# Patient Record
Sex: Female | Born: 1950 | Race: Black or African American | Hispanic: No | Smoking: Never smoker
Health system: Southern US, Community
[De-identification: ages and names within clinical notes are randomized; demographics above are authoritative.]

## PROBLEM LIST (undated history)

## (undated) DIAGNOSIS — M545 Low back pain, unspecified: Secondary | ICD-10-CM

## (undated) DIAGNOSIS — I10 Essential (primary) hypertension: Secondary | ICD-10-CM

## (undated) DIAGNOSIS — G8929 Other chronic pain: Secondary | ICD-10-CM

## (undated) DIAGNOSIS — E785 Hyperlipidemia, unspecified: Secondary | ICD-10-CM

## (undated) DIAGNOSIS — K5900 Constipation, unspecified: Secondary | ICD-10-CM

## (undated) DIAGNOSIS — R9431 Abnormal electrocardiogram [ECG] [EKG]: Secondary | ICD-10-CM

## (undated) DIAGNOSIS — R079 Chest pain, unspecified: Secondary | ICD-10-CM

## (undated) DIAGNOSIS — M797 Fibromyalgia: Secondary | ICD-10-CM

## (undated) HISTORY — DX: Abnormal electrocardiogram (ECG) (EKG): R94.31

## (undated) HISTORY — DX: Chest pain, unspecified: R07.9

## (undated) HISTORY — PX: CARPAL TUNNEL RELEASE: SHX101

---

## 1999-05-13 HISTORY — PX: BACK SURGERY: SHX140

## 2016-07-25 ENCOUNTER — Observation Stay (HOSPITAL_COMMUNITY)
Admission: EM | Admit: 2016-07-25 | Discharge: 2016-07-26 | Disposition: A | Discharge status: Home / Self Care | Payer: Medicare Other | Attending: Internal Medicine | Admitting: Internal Medicine

## 2016-07-25 ENCOUNTER — Emergency Department (HOSPITAL_COMMUNITY): Payer: Medicare Other

## 2016-07-25 ENCOUNTER — Encounter (HOSPITAL_COMMUNITY): Payer: Self-pay

## 2016-07-25 DIAGNOSIS — R072 Precordial pain: Secondary | ICD-10-CM | POA: Diagnosis not present

## 2016-07-25 DIAGNOSIS — Z7982 Long term (current) use of aspirin: Secondary | ICD-10-CM | POA: Diagnosis not present

## 2016-07-25 DIAGNOSIS — E785 Hyperlipidemia, unspecified: Secondary | ICD-10-CM | POA: Diagnosis not present

## 2016-07-25 DIAGNOSIS — R0609 Other forms of dyspnea: Secondary | ICD-10-CM | POA: Diagnosis not present

## 2016-07-25 DIAGNOSIS — I10 Essential (primary) hypertension: Secondary | ICD-10-CM

## 2016-07-25 DIAGNOSIS — R51 Headache: Secondary | ICD-10-CM | POA: Diagnosis not present

## 2016-07-25 DIAGNOSIS — E782 Mixed hyperlipidemia: Secondary | ICD-10-CM

## 2016-07-25 DIAGNOSIS — R519 Headache, unspecified: Secondary | ICD-10-CM

## 2016-07-25 DIAGNOSIS — Z79899 Other long term (current) drug therapy: Secondary | ICD-10-CM | POA: Insufficient documentation

## 2016-07-25 DIAGNOSIS — R079 Chest pain, unspecified: Secondary | ICD-10-CM | POA: Diagnosis not present

## 2016-07-25 DIAGNOSIS — R9431 Abnormal electrocardiogram [ECG] [EKG]: Secondary | ICD-10-CM | POA: Diagnosis not present

## 2016-07-25 HISTORY — DX: Low back pain, unspecified: M54.50

## 2016-07-25 HISTORY — DX: Low back pain: M54.5

## 2016-07-25 HISTORY — DX: Constipation, unspecified: K59.00

## 2016-07-25 HISTORY — DX: Fibromyalgia: M79.7

## 2016-07-25 HISTORY — DX: Hyperlipidemia, unspecified: E78.5

## 2016-07-25 HISTORY — DX: Essential (primary) hypertension: I10

## 2016-07-25 HISTORY — DX: Other chronic pain: G89.29

## 2016-07-25 LAB — CBC
HEMATOCRIT: 38.9 % (ref 36.0–46.0)
HEMATOCRIT: 38.7 % (ref 36.0–46.0)
HEMOGLOBIN: 12.4 g/dL (ref 12.0–15.0)
Hemoglobin: 12.4 g/dL (ref 12.0–15.0)
MCH: 28.4 pg (ref 26.0–34.0)
MCH: 28.5 pg (ref 26.0–34.0)
MCHC: 31.9 g/dL (ref 30.0–36.0)
MCHC: 32 g/dL (ref 30.0–36.0)
MCV: 89.2 fL (ref 78.0–100.0)
MCV: 89 fL (ref 78.0–100.0)
PLATELETS: 185 10*3/uL (ref 150–400)
Platelets: 201 10*3/uL (ref 150–400)
RBC: 4.36 MIL/uL (ref 3.87–5.11)
RBC: 4.35 MIL/uL (ref 3.87–5.11)
RDW: 13.2 % (ref 11.5–15.5)
RDW: 13.2 % (ref 11.5–15.5)
WBC: 4 10*3/uL (ref 4.0–10.5)
WBC: 5.2 10*3/uL (ref 4.0–10.5)

## 2016-07-25 LAB — BASIC METABOLIC PANEL
ANION GAP: 9 (ref 5–15)
BUN: 12 mg/dL (ref 6–20)
CHLORIDE: 105 mmol/L (ref 101–111)
CO2: 25 mmol/L (ref 22–32)
Calcium: 9.4 mg/dL (ref 8.9–10.3)
Creatinine, Ser: 0.82 mg/dL (ref 0.44–1.00)
GFR calc Af Amer: >60 mL/min (ref >60)
GLUCOSE: 116 mg/dL — AB (ref 65–99)
POTASSIUM: 3.8 mmol/L (ref 3.5–5.1)
Sodium: 139 mmol/L (ref 135–145)

## 2016-07-25 LAB — CREATININE, SERUM: Creatinine, Ser: 0.89 mg/dL (ref 0.44–1.00)

## 2016-07-25 LAB — I-STAT TROPONIN, ED: Troponin i, poc: 0 ng/mL (ref 0.00–0.08)

## 2016-07-25 LAB — LIPASE, BLOOD: Lipase: 15 U/L (ref 11–51)

## 2016-07-25 LAB — TROPONIN I: Troponin I: <0.03 ng/mL (ref <0.03)

## 2016-07-25 MED ORDER — ORPHENADRINE CITRATE ER 100 MG PO TB12
100.0000 mg | ORAL_TABLET | Freq: Two times a day (BID) | ORAL | Status: DC
  Administered 2016-07-26 (×2): 100 mg via ORAL
  Filled 2016-07-25 (×3): qty 1

## 2016-07-25 MED ORDER — SODIUM CHLORIDE 0.9% FLUSH: 3.0000 mL | INTRAVENOUS | Status: DC | PRN

## 2016-07-25 MED ORDER — SODIUM CHLORIDE 0.9 % IV SOLN: 250.0000 mL | INTRAVENOUS | Status: DC | PRN

## 2016-07-25 MED ORDER — IOPAMIDOL (ISOVUE-370) INJECTION 76%
INTRAVENOUS | Status: AC
  Filled 2016-07-25: qty 100

## 2016-07-25 MED ORDER — LINACLOTIDE 145 MCG PO CAPS
290.0000 ug | ORAL_CAPSULE | Freq: Every day | ORAL | Status: DC
  Administered 2016-07-26: 290 ug via ORAL
  Filled 2016-07-25: qty 2

## 2016-07-25 MED ORDER — HYDROCODONE-ACETAMINOPHEN 7.5-325 MG PO TABS
1.0000 | ORAL_TABLET | Freq: Four times a day (QID) | ORAL | Status: DC | PRN
  Administered 2016-07-26: 1 via ORAL
  Filled 2016-07-25: qty 1

## 2016-07-25 MED ORDER — PANTOPRAZOLE SODIUM 40 MG PO TBEC
40.0000 mg | DELAYED_RELEASE_TABLET | Freq: Every day | ORAL | Status: DC
  Administered 2016-07-26: 40 mg via ORAL
  Filled 2016-07-25 (×2): qty 1

## 2016-07-25 MED ORDER — ZOLPIDEM TARTRATE 5 MG PO TABS: 5.0000 mg | ORAL_TABLET | Freq: Every evening | ORAL | Status: DC | PRN

## 2016-07-25 MED ORDER — ONDANSETRON HCL 4 MG/2ML IJ SOLN: 4.0000 mg | Freq: Four times a day (QID) | INTRAMUSCULAR | Status: DC | PRN

## 2016-07-25 MED ORDER — SODIUM CHLORIDE 0.9% FLUSH
3.0000 mL | Freq: Two times a day (BID) | INTRAVENOUS | Status: DC
  Administered 2016-07-26 (×2): 3 mL via INTRAVENOUS

## 2016-07-25 MED ORDER — SIMVASTATIN 10 MG PO TABS
10.0000 mg | ORAL_TABLET | Freq: Every day | ORAL | Status: DC
  Filled 2016-07-25: qty 1

## 2016-07-25 MED ORDER — ACETAMINOPHEN 325 MG PO TABS: 650.0000 mg | ORAL_TABLET | ORAL | Status: DC | PRN

## 2016-07-25 MED ORDER — ASPIRIN EC 81 MG PO TBEC
81.0000 mg | DELAYED_RELEASE_TABLET | Freq: Every day | ORAL | Status: DC
  Administered 2016-07-26: 81 mg via ORAL
  Filled 2016-07-25: qty 1

## 2016-07-25 MED ORDER — NITROGLYCERIN 0.4 MG SL SUBL
0.4000 mg | SUBLINGUAL_TABLET | SUBLINGUAL | Status: DC | PRN
  Administered 2016-07-25: 0.4 mg via SUBLINGUAL
  Filled 2016-07-25: qty 1

## 2016-07-25 MED ORDER — ENOXAPARIN SODIUM 40 MG/0.4ML ~~LOC~~ SOLN: 40.0000 mg | Freq: Every day | SUBCUTANEOUS | Status: DC

## 2016-07-25 NOTE — ED Notes (Signed)
RN attempted to call report; RN to call back; 

## 2016-07-25 NOTE — H&P (Signed)
History & Physical    Patient ID: Samantha Haley MRN: [id number], DOB/AGE: 05/24/1950   Admit date: 07/25/2016   Primary Physician: Pcp Not In System Primary Cardiologist: New - will f/u in [place]  Patient Profile    66 year old female without prior cardiac history who presents on transfer from [facility] with chest pain and concern for EKG changes.  Past Medical History    Past Medical History:  Diagnosis Date  . Chronic lower back pain   . Constipation   . Fibromyalgia   . Hyperlipidemia   . Hypertension     Past Surgical History:  Procedure Laterality Date  . BACK SURGERY  2001     Allergies  Allergies  Allergen Reactions  . Lipitor [Atorvastatin]     Myalgias   . Lovastatin     myalgias  . Sulfa Antibiotics     History of Present Illness    66 year old female with a prior cardiac history. She does have a history of hypertension, hyperlipidemia, follow myalgia, and chronic low back pain. 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 She has never smoked cigarettes and despite her chronic back pain is reasonably active, performing water aerobics up to 5 times per week. She was in her usual state of health until earlier this week, when she completed water aerobics and walking getting out of the pool, noted dyspnea. This was unusual for her. She again had some dyspnea on Thursday, March 15 and was awakened by substernal chest heaviness and dyspnea in the early morning hours of March 16. This persisted throughout the morning and became associated with left arm numbness. For these reasons, she presented to a local urgent care in [place]. She was advised that she would need to present to the emergency department. Her friend drove her to the emergency department at [facility] where there was concern for possible ST segment elevation. A code STEMI was called and she was transferred to [facility] for further evaluation. She  continued to have nagging chest discomfort throughout the trip to [facility]. Follow-up EKG here showed no evidence of ST segment elevation. Review of EKGs from [facility] show significant baseline artifact. Areas of concern for ST segment elevation were notable for significant artifact. Troponin here is normal. Patient continues to have mild chest discomfort and a sensation as though she is dyspneic. Vital signs are stable and she is oxygenating well. Chest x-ray is without acute findings.  Of note, she has also had headaches, which have been concerning for her as she does not usually have headaches.  Home Medications    Prior to Admission medications   Medication Sig Start Date End Date Taking? Authorizing Provider  aspirin EC 81 MG tablet Take 81 mg by mouth daily.   Yes Historical Provider, MD  eszopiclone (LUNESTA) 1 MG TABS tablet Take 1 mg by mouth at bedtime. 07/01/16  Yes Historical Provider, MD  HYDROcodone-acetaminophen (NORCO) 7.5-325 MG tablet Take 1 tablet by mouth every 6 (six) hours as needed for pain. 07/07/16  Yes Historical Provider, MD  LINZESS 290 MCG CAPS capsule Take 290 mcg by mouth daily. 30 minutes prior to first meal 07/17/16  Yes Historical Provider, MD  orphenadrine (NORFLEX) 100 MG tablet Take 100 mg by mouth 2 (two) times daily. 07/07/16  Yes Historical Provider, MD  simvastatin (ZOCOR) 10 MG tablet Take 10 mg by mouth daily at 6 PM.   Yes Historical Provider, MD    Family History  Family History  Problem Relation Age of Onset  . Hypertension Mother   . Hyperlipidemia Mother   . Alzheimer's disease Father   . Diabetes Father   . Hypertension Father   . Hypertension Sister   . Rheumatic fever Sister    Social History    Social History   Social History  . Marital status: Divorced    Spouse name: N/A  . Number of children: N/A  . Years of education: N/A   Occupational History  . Not on file.   Social History Main Topics  . Smoking status: Never Smoker  .  Smokeless tobacco: Never Used  . Alcohol use No  . Drug use: No  . Sexual activity: Not on file   Other Topics Concern  . Not on file   Social History Narrative   Lives in [place], [place].  Lives with a 66 y/o man that she helps to take care of. H2O aerobics 5 d/wk.  Previously was licensed as a Lawyer.     Review of Systems    General:  No chills, fever, night sweats or weight changes.  Cardiovascular:  +++ chest pain, +++ dyspnea on exertion, no edema, orthopnea, palpitations, paroxysmal nocturnal dyspnea. Dermatological: No rash, lesions/masses Respiratory: No cough, +++ dyspnea Urologic: No hematuria, dysuria Abdominal:   No nausea, vomiting, diarrhea, bright red blood per rectum, melena, or hematemesis Neurologic: +++ Headache.  No visual changes, wkns, changes in mental status. All other systems reviewed and are otherwise negative except as noted above.  Physical Exam    Blood pressure 114/69, pulse 69, temperature 98.3 F (36.8 C), temperature source Oral, resp. rate 15, height 5\' 2"  (1.575 m), weight 165 lb (74.8 kg), SpO2 100 %.  General: Pleasant, NAD Psych: Normal affect. Neuro: Alert and oriented X 3. Moves all extremities spontaneously. HEENT: Normal  Neck: Supple without bruits or JVD. Lungs:  Resp regular and unlabored, CTA. Heart: RRR no s3, s4, or murmurs. Abdomen: Soft, non-tender, non-distended, BS + x 4.  Extremities: No clubbing, cyanosis or edema. DP/PT/Radials 2+ and equal bilaterally.  Labs    Troponin [facility] Test)  Recent Labs  07/25/16 1455  TROPIPOC 0.00   Lab Results  Component Value Date   WBC 4.0 07/25/2016   HGB 12.4 07/25/2016   HCT 38.9 07/25/2016   MCV 89.2 07/25/2016   PLT 201 07/25/2016     Recent Labs Lab 07/25/16 1429  NA 139  K 3.8  CL 105  CO2 25  BUN 12  CREATININE 0.82  CALCIUM 9.4  GLUCOSE 116*     Radiology Studies    Dg Chest 2 View  Result Date: 07/25/2016 CLINICAL DATA:  SOB started 2 nights  ago, then chest pain and left arm numbness started last night. Feels fatigued and dizzy. Increased blood pressure today. Patient reports history of hypotension and chart documents hypertension. EXAM: CHEST  2 VIEW COMPARISON:  None. FINDINGS: All normal cardiac silhouette. There is mild coarsened central interstitial markings. No pleural effusion. No consolidation. No pneumothorax. The IMPRESSION: No active cardiopulmonary disease. Electronically Signed   By: Genevive Bi M.D.   On: 07/25/2016 15:50   ECG & Cardiac Imaging    rsr, 74, no acute st/t changes.  Assessment & Plan    1. Retrosternal chest pain with dyspnea: Patient presents with a 16 hour history of retrosternal chest discomfort and a 2 day history of dyspnea on exertion that has been more persistent over the past 16 hours. She  was initially evaluated at an urgent care [place] and [place] [facility]. There, there was concern for ST segment elevation, however review of EKGs shows that areas of concern correspond with artifact. EKG here shows no ST elevation or depression. She continues to have chest pain however despite prolonged symptoms, initial troponin is normal. She remains short of breath as well. She is not hypoxic and vital signs are otherwise stable. Chest x-ray is nonacute. We will rule out pulmonary embolism with CT angios the chest with contrast. We will also cycle cardiac markers and heparinize if necessary.  If troponins remain normal despite prolonged cp, ACS/ischemia very unlikely.  Could consider outpt stress testing if appropriate.  Will add PPI.  2. Headache with left arm numbness: Patient has been having headaches and developed left arm numbness in the setting of chest pain and dyspnea today. Neurologic exam is otherwise unremarkable. We will check a head CT to rule out cranial abnormalities as a potential cause of headaches and numbness.  3. Essential hypertension:  Blood pressure is stable. She does not appear to be on any medications at home. Next  4. Hyperlipidemia: She is on low-dose simvastatin therapy. She was previously intolerant to Lipitor and lovastatin. Her primary care is working with her to find a regimen that she tolerates and gets her LDL down. She may require Zetia on top of her simvastatin.  Signed, Nicolasa Duckinghristopher Nicci Vaughan, NP 07/25/2016, 6:58 PM

## 2016-07-25 NOTE — ED Triage Notes (Signed)
Pt arrives 2/10 chest pain received treatment at outside facility

## 2016-07-25 NOTE — ED Provider Notes (Signed)
[facility]-EMERGENCY DEPT Provider Note   CSN: [id number] Arrival date & time: 07/25/16  1418     History   Chief Complaint Chief Complaint  Patient presents with  . Chest Pain    pt sent [facility] for chest pain that began on Wed after exercising pt had some treatment at outside facility     HPI Samantha Haley is a 66 y.o. female PMH HTN, HLD and fibromyalgia presents as transfer for possible STEMI. Chest pressure, dyspnea and numbness in left hand started Wednesday afternoon. Pt states pain has been constant and kept her up all of last night, 8/10. She took several baby aspirin at home w/o relief. Pt presented to Urgent Care today where there was concern for STEMI. Isolated ST elevations in multiple leads for one beat, possible artifact. [facility] she was given Heparin bolus and nitro paste was placed and [facility]. Pt reports pain improved after medication. Cardiologist evaluated on arrival and d/c'd CODE STEMI. Pain is currently 2/10. Non-smoker, no family hx.   The history is provided by the patient and medical records.  Chest Pain   This is a new problem. The current episode started 2 days ago. The problem occurs constantly. The problem has been gradually improving. The pain is associated with rest. The pain is present in the lateral region. The pain is at a severity of 2/10. The pain is mild. The quality of the pain is described as pressure-like. The pain does not radiate. Duration of episode(s) is 2 days. Associated symptoms include numbness and shortness of breath. Pertinent negatives include no abdominal pain, no back pain, no claudication, no cough, no diaphoresis, no fever, no headaches, no hemoptysis, no leg pain, no lower extremity edema, no nausea, no near-syncope, no palpitations, no syncope, no vomiting and no weakness. She has tried nitroglycerin and rest for the symptoms. The treatment provided mild relief.  Her past medical  history is significant for hyperlipidemia and hypertension.  Pertinent negatives for past medical history include no seizures.  Pertinent negatives for family medical history include: no CAD.  Procedure history is negative for cardiac catheterization, echocardiogram and stress echo.    Past Medical History:  Diagnosis Date  . Chronic lower back pain   . Constipation   . Fibromyalgia   . Hyperlipidemia   . Hypertension     Patient Active Problem List   Diagnosis Date Noted  . Chest pain 07/25/2016  . Abnormal ECG 07/25/2016  . HTN (hypertension) 07/25/2016  . HLD (hyperlipidemia) 07/25/2016    Past Surgical History:  Procedure Laterality Date  . BACK SURGERY  2001    OB History    No data available       Home Medications    Prior to Admission medications   Medication Sig Start Date End Date Taking? Authorizing Provider  aspirin EC 81 MG tablet Take 81 mg by mouth daily.   Yes Historical Provider, MD  eszopiclone (LUNESTA) 1 MG TABS tablet Take 1 mg by mouth at bedtime. 07/01/16  Yes Historical Provider, MD  HYDROcodone-acetaminophen (NORCO) 7.5-325 MG tablet Take 1 tablet by mouth every 6 (six) hours as needed for pain. 07/07/16  Yes Historical Provider, MD  LINZESS 290 MCG CAPS capsule Take 290 mcg by mouth daily. 30 minutes prior to first meal 07/17/16  Yes Historical Provider, MD  orphenadrine (NORFLEX) 100 MG tablet Take 100 mg by mouth 2 (two) times daily. 07/07/16  Yes Historical Provider, MD  simvastatin (ZOCOR) 10 MG tablet Take 10 mg by mouth daily at 6 PM.   Yes Historical Provider, MD    Family History Family History  Problem Relation Age of Onset  . Hypertension Mother   . Hyperlipidemia Mother   . Alzheimer's disease Father   . Diabetes Father   . Hypertension Father   . Hypertension Sister   . Rheumatic fever Sister     Social History Social History  Substance Use Topics  . Smoking status: Never Smoker  . Smokeless tobacco: Never Used  . Alcohol  use No     Allergies   Lipitor [atorvastatin]; Lovastatin; and Sulfa antibiotics   Review of Systems Review of Systems  Constitutional: Negative for diaphoresis and fever.  Respiratory: Positive for shortness of breath. Negative for cough and hemoptysis.   Cardiovascular: Positive for chest pain. Negative for palpitations, claudication, leg swelling, syncope and near-syncope.  Gastrointestinal: Negative for abdominal pain, diarrhea, nausea and vomiting.  Musculoskeletal: Negative for back pain, neck pain and neck stiffness.  Neurological: Positive for numbness. Negative for seizures, facial asymmetry, speech difficulty, weakness, light-headedness and headaches.  All other systems reviewed and are negative.    Physical Exam Updated Vital Signs BP 111/68   Pulse 63   Temp 98.7 F (37.1 C) (Oral)   Resp 12   Ht 5\' 2"  (1.575 m)   Wt 75.3 kg   SpO2 100%   BMI 30.34 kg/m   Physical Exam  Constitutional: She is oriented to person, place, and time. She appears well-developed and well-nourished. No distress.  HENT:  Head: Normocephalic and atraumatic.  Eyes: Conjunctivae and EOM are normal.  Neck: Normal range of motion. Neck supple.  Cardiovascular: Normal rate and regular rhythm.   Pulmonary/Chest: Effort normal and breath sounds normal. No respiratory distress. She exhibits no tenderness.  Abdominal: Soft. She exhibits no distension. There is no tenderness.  Musculoskeletal: Normal range of motion. She exhibits no edema.  Neurological: She is alert and oriented to person, place, and time.  Skin: Skin is warm and dry. Capillary refill takes less than 2 seconds.  Psychiatric: She has a normal mood and affect.  Nursing note and vitals reviewed.    ED Treatments / Results  Labs (all labs ordered are listed, but only abnormal results are displayed) Labs Reviewed  BASIC METABOLIC PANEL - Abnormal; Notable for the following:       Result Value   Glucose, Bld 116 (*)    All  other components within normal limits  CBC  CBC  CREATININE, SERUM  TROPONIN I  TROPONIN I  TROPONIN I  COMPREHENSIVE METABOLIC PANEL  LIPASE, BLOOD  I-STAT TROPOININ, ED    EKG  EKG Interpretation  Date/Time:  Friday July 25 2016 14:19:50 EDT Ventricular Rate:  74 PR Interval:    QRS Duration: 77 QT Interval:  373 QTC Calculation: 414 R Axis:   63 Text Interpretation:  Sinus rhythm Confirmed by Jodi Mourning MD, Ivin Booty [phone]) on 07/25/2016 3:37:13 PM       Radiology Dg Chest 2 View  Result Date: 07/25/2016 CLINICAL DATA:  SOB started 2 nights ago, then chest pain and left arm numbness started last night. Feels fatigued and dizzy. Increased blood pressure today. Patient reports history of hypotension and chart documents hypertension. EXAM: CHEST  2 VIEW COMPARISON:  None. FINDINGS: All normal cardiac silhouette. There is mild coarsened central interstitial markings. No pleural effusion. No consolidation. No pneumothorax. The IMPRESSION: No active cardiopulmonary disease. Electronically Signed  By: Genevive Bi M.D.   On: 07/25/2016 15:50   Ct Head Wo Contrast  Result Date: 07/25/2016 CLINICAL DATA:  67 y/o  F; headache with numbness in the left arm. EXAM: CT HEAD WITHOUT CONTRAST TECHNIQUE: Contiguous axial images were obtained from the base of the skull through the vertex without intravenous contrast. COMPARISON:  None. FINDINGS: Brain: No evidence of acute infarction, hemorrhage, hydrocephalus, extra-axial collection or mass lesion/mass effect. Vascular: Mild calcific atherosclerosis of cavernous and paraclinoid segments of internal carotid arteries. Skull: Normal. Negative for fracture or focal lesion. Sinuses/Orbits: No acute finding. Other: None. IMPRESSION: 1. No acute intracranial abnormality identified. 2. Unremarkable CT of the head. Electronically Signed   By: Mitzi Hansen M.D.   On: 07/25/2016 21:23   Ct Angio Chest Pe W Or Wo Contrast  Result Date:  07/25/2016 CLINICAL DATA:  Chest pain, headache, and shortness of breath starting Wednesday. Numbness in the left arm starting this morning. Nonsmoker. EXAM: CT ANGIOGRAPHY CHEST WITH CONTRAST TECHNIQUE: Multidetector CT imaging of the chest was performed using the standard protocol during bolus administration of intravenous contrast. Multiplanar CT image reconstructions and MIPs were obtained to evaluate the vascular anatomy. CONTRAST:  60 mL Isovue 370 COMPARISON:  Chest 07/25/2016 FINDINGS: Cardiovascular: Satisfactory opacification of the pulmonary arteries to the segmental level. No evidence of pulmonary embolism. Normal heart size. No pericardial effusion. Normal caliber thoracic aorta. No aortic dissection. Mediastinum/Nodes: No enlarged mediastinal, hilar, or axillary lymph nodes. Thyroid gland, trachea, and esophagus demonstrate no significant findings. Small esophageal hiatal hernia. Lungs/Pleura: Mild dependent changes in the lung bases. No focal airspace disease or consolidation. No pleural effusions. No pneumothorax. Airways are patent. Upper Abdomen: No acute abnormality. Musculoskeletal: Degenerative changes in the spine. No destructive bone lesions. Review of the MIP images confirms the above findings. IMPRESSION: No evidence of significant pulmonary embolus. No evidence of active pulmonary disease. Electronically Signed   By: Burman Nieves M.D.   On: 07/25/2016 21:22    Procedures Procedures (including critical care time)  Medications Ordered in ED Medications  aspirin EC tablet 81 mg (not administered)  zolpidem (AMBIEN) tablet 5 mg (not administered)  HYDROcodone-acetaminophen (NORCO) 7.5-325 MG per tablet 1 tablet (not administered)  linaclotide (LINZESS) capsule 290 mcg (not administered)  orphenadrine (NORFLEX) 12 hr tablet 100 mg (not administered)  simvastatin (ZOCOR) tablet 10 mg (not administered)  nitroGLYCERIN (NITROSTAT) SL tablet 0.4 mg (0.4 mg Sublingual Given 07/25/16  2349)  acetaminophen (TYLENOL) tablet 650 mg (not administered)  ondansetron (ZOFRAN) injection 4 mg (not administered)  enoxaparin (LOVENOX) injection 40 mg (not administered)  sodium chloride flush (NS) 0.9 % injection 3 mL (not administered)  sodium chloride flush (NS) 0.9 % injection 3 mL (not administered)  0.9 %  sodium chloride infusion (not administered)  pantoprazole (PROTONIX) EC tablet 40 mg (not administered)  iopamidol (ISOVUE-370) 76 % injection (not administered)     Initial Impression / Assessment and Plan / ED Course  I have reviewed the triage vital signs and the nursing notes.  Pertinent labs & imaging results that were available during my care of the patient were reviewed by me and considered in my medical decision making (see chart for details).    66 y.o. female p/w with chest pain concerning for angina vs musculoskeletal pain. VSS, NAD. EKG on arrival NSR, no ischemic changes. Initial Troponin, CBC, BMP unremarkable. CXR unremarkable. HEAR score 4, no previous cardiac work-up. Consult to Cardiology, will admit. CTA negative for PE.   Discussed  with my attending physician, Dr Jodi Mourning.    Final Clinical Impressions(s) / ED Diagnoses   Final diagnoses:  Headache  Headache    New Prescriptions Current Discharge Medication List       Pablo Ledger, MD 07/26/16 0001    Blane Ohara, MD 07/26/16 [phone]

## 2016-07-26 ENCOUNTER — Other Ambulatory Visit: Payer: Self-pay | Admitting: Physician Assistant

## 2016-07-26 ENCOUNTER — Observation Stay (HOSPITAL_BASED_OUTPATIENT_CLINIC_OR_DEPARTMENT_OTHER): Payer: Medicare Other

## 2016-07-26 DIAGNOSIS — E785 Hyperlipidemia, unspecified: Secondary | ICD-10-CM | POA: Diagnosis not present

## 2016-07-26 DIAGNOSIS — R072 Precordial pain: Secondary | ICD-10-CM | POA: Diagnosis not present

## 2016-07-26 DIAGNOSIS — R079 Chest pain, unspecified: Secondary | ICD-10-CM | POA: Diagnosis not present

## 2016-07-26 DIAGNOSIS — R51 Headache: Secondary | ICD-10-CM | POA: Diagnosis not present

## 2016-07-26 DIAGNOSIS — E782 Mixed hyperlipidemia: Secondary | ICD-10-CM | POA: Diagnosis not present

## 2016-07-26 DIAGNOSIS — R9431 Abnormal electrocardiogram [ECG] [EKG]: Secondary | ICD-10-CM | POA: Diagnosis not present

## 2016-07-26 LAB — ECHOCARDIOGRAM COMPLETE
Area-P 1/2: 1.41 cm2
CHL CUP DOP CALC LVOT VTI: 23.1 cm
CHL CUP TV REG PEAK VELOCITY: 238 cm/s
E/e' ratio: 8.76
EWDT: 257 ms
FS: 38 % (ref 28–44)
Height: 62 in
IVS/LV PW RATIO, ED: 1
LA ID, A-P, ES: 33 mm
LA diam end sys: 33 mm
LA diam index: 1.79 cm/m2
LA vol A4C: 42.2 ml
LA vol index: 21.2 mL/m2
LAVOL: 39.1 mL
LDCA: 2.54 cm2
LV E/e'average: 8.76
LV PW d: 10 mm — AB (ref 0.6–1.1)
LV SIMPSON'S DISK: 70
LV sys vol index: 11 mL/m2
LVDIAVOL: 67 mL (ref 46–106)
LVDIAVOLIN: 36 mL/m2
LVEEMED: 8.76
LVELAT: 10.2 cm/s
LVOT peak grad rest: 6 mmHg
LVOTD: 18 mm
LVOTPV: 127 cm/s
LVOTSV: 59 mL
LVSYSVOL: 21 mL (ref 14–42)
MV Dec: 257
MV Peak grad: 3 mmHg
MV pk E vel: 89.4 m/s
MVPKAVEL: 96.3 m/s
P 1/2 time: 75 ms
RV LATERAL S' VELOCITY: 15.6 cm/s
RV sys press: 26 mmHg
Stroke v: 47 ml
TAPSE: 28.3 mm
TDI e' lateral: 10.2
TDI e' medial: 6.74
TR max vel: 238 cm/s
Weight: 2654.4 oz

## 2016-07-26 LAB — TROPONIN I: Troponin I: <0.03 ng/mL (ref <0.03)

## 2016-07-26 LAB — COMPREHENSIVE METABOLIC PANEL
ALT: 19 U/L (ref 14–54)
AST: 22 U/L (ref 15–41)
Albumin: 3.9 g/dL (ref 3.5–5.0)
Alkaline Phosphatase: 53 U/L (ref 38–126)
Anion gap: 8 (ref 5–15)
BUN: 9 mg/dL (ref 6–20)
CO2: 27 mmol/L (ref 22–32)
CREATININE: 0.94 mg/dL (ref 0.44–1.00)
Calcium: 9.5 mg/dL (ref 8.9–10.3)
Chloride: 105 mmol/L (ref 101–111)
GFR calc non Af Amer: >60 mL/min (ref >60)
Glucose, Bld: 98 mg/dL (ref 65–99)
Potassium: 4 mmol/L (ref 3.5–5.1)
SODIUM: 140 mmol/L (ref 135–145)
Total Bilirubin: 0.7 mg/dL (ref 0.3–1.2)
Total Protein: 6.2 g/dL — ABNORMAL LOW (ref 6.5–8.1)

## 2016-07-26 MED ORDER — PANTOPRAZOLE SODIUM 40 MG PO TBEC
40.0000 mg | DELAYED_RELEASE_TABLET | Freq: Every day | ORAL | 3 refills | Status: DC
Start: 2016-07-27 — End: 2016-12-02

## 2016-07-26 NOTE — Progress Notes (Signed)
*  PRELIMINARY RESULTS* Echocardiogram 2D Echocardiogram has been performed.  Stacey DrainWhite, Desarea Ohagan J 07/26/2016, 1:27 PM

## 2016-07-26 NOTE — Progress Notes (Addendum)
Progress Note  Patient Name: Samantha Haley Date of Encounter: 07/26/2016  Primary Cardiologist: Hilty  Subjective   66 yo with hx of HTN, hyperlipidemia , Presented with CP . Left arm numbness, severe headache, progressive shortness of breath.  Echocardiogram has been ordered - but will not be done for some time  Troponins remained negative. CT angiogram of the chest reveals no evidence of pulmonary embolus. There is no active pulmonary disease    Inpatient Medications    Scheduled Meds: . aspirin EC  81 mg Oral Daily  . enoxaparin (LOVENOX) injection  40 mg Subcutaneous Daily  . linaclotide  290 mcg Oral QAC breakfast  . orphenadrine  100 mg Oral BID  . pantoprazole  40 mg Oral Q0600  . simvastatin  10 mg Oral q1800  . sodium chloride flush  3 mL Intravenous Q12H   Continuous Infusions:  PRN Meds: sodium chloride, acetaminophen, HYDROcodone-acetaminophen, nitroGLYCERIN, ondansetron (ZOFRAN) IV, sodium chloride flush, zolpidem   Vital Signs    Vitals:   07/25/16 2348 07/26/16 0002 07/26/16 0500 07/26/16 0735  BP: 111/68  105/69 118/68  Pulse:   (!) 56   Resp: 12 12 15    Temp:   98.6 F (37 C) 98.8 F (37.1 C)  TempSrc:   Oral Oral  SpO2:   100%   Weight:      Height:        Intake/Output Summary (Last 24 hours) at 07/26/16 0850 Last data filed at 07/25/16 2200  Gross per 24 hour  Intake              120 ml  Output                0 ml  Net              120 ml   Filed Weights   07/25/16 1421 07/25/16 2257  Weight: 165 lb (74.8 kg) 165 lb 14.4 oz (75.3 kg)    Telemetry    NSR  - Personally Reviewed  ECG    NSR ,   Early repol  - Personally Reviewed  Physical Exam   GEN: No acute distress.   Neck: No JVD Cardiac: RRR, no murmurs, rubs, or gallops.  Respiratory: Clear to auscultation bilaterally. GI: Soft, nontender, non-distended  MS: No edema; No deformity. Legs are nontender Neuro:  Nonfocal  Psych: Normal affect   Labs     Chemistry Recent Labs Lab 07/25/16 1429 07/25/16 2305  NA 139  --   K 3.8  --   CL 105  --   CO2 25  --   GLUCOSE 116*  --   BUN 12  --   CREATININE 0.82 0.89  CALCIUM 9.4  --   GFRNONAA >60 >60  GFRAA >60 >60  ANIONGAP 9  --      Hematology Recent Labs Lab 07/25/16 1429 07/25/16 2305  WBC 4.0 5.2  RBC 4.36 4.35  HGB 12.4 12.4  HCT 38.9 38.7  MCV 89.2 89.0  MCH 28.4 28.5  MCHC 31.9 32.0  RDW 13.2 13.2  PLT 201 185    Cardiac Enzymes Recent Labs Lab 07/25/16 2305  TROPONINI <0.03    Recent Labs Lab 07/25/16 1455  TROPIPOC 0.00     BNPNo results for input(s): BNP, PROBNP in the last 168 hours.   DDimer No results for input(s): DDIMER in the last 168 hours.   Radiology    Dg Chest 2 View  Result Date: 07/25/2016 CLINICAL  DATA:  SOB started 2 nights ago, then chest pain and left arm numbness started last night. Feels fatigued and dizzy. Increased blood pressure today. Patient reports history of hypotension and chart documents hypertension. EXAM: CHEST  2 VIEW COMPARISON:  None. FINDINGS: All normal cardiac silhouette. There is mild coarsened central interstitial markings. No pleural effusion. No consolidation. No pneumothorax. The IMPRESSION: No active cardiopulmonary disease. Electronically Signed   By: Genevive BiStewart  Edmunds M.D.   On: 07/25/2016 15:50   Ct Head Wo Contrast  Result Date: 07/25/2016 CLINICAL DATA:  66 y/o  F; headache with numbness in the left arm. EXAM: CT HEAD WITHOUT CONTRAST TECHNIQUE: Contiguous axial images were obtained from the base of the skull through the vertex without intravenous contrast. COMPARISON:  None. FINDINGS: Brain: No evidence of acute infarction, hemorrhage, hydrocephalus, extra-axial collection or mass lesion/mass effect. Vascular: Mild calcific atherosclerosis of cavernous and paraclinoid segments of internal carotid arteries. Skull: Normal. Negative for fracture or focal lesion. Sinuses/Orbits: No acute finding. Other:  None. IMPRESSION: 1. No acute intracranial abnormality identified. 2. Unremarkable CT of the head. Electronically Signed   By: Mitzi HansenLance  Furusawa-Stratton M.D.   On: 07/25/2016 21:23   Ct Angio Chest Pe W Or Wo Contrast  Result Date: 07/25/2016 CLINICAL DATA:  Chest pain, headache, and shortness of breath starting Wednesday. Numbness in the left arm starting this morning. Nonsmoker. EXAM: CT ANGIOGRAPHY CHEST WITH CONTRAST TECHNIQUE: Multidetector CT imaging of the chest was performed using the standard protocol during bolus administration of intravenous contrast. Multiplanar CT image reconstructions and MIPs were obtained to evaluate the vascular anatomy. CONTRAST:  60 mL Isovue 370 COMPARISON:  Chest 07/25/2016 FINDINGS: Cardiovascular: Satisfactory opacification of the pulmonary arteries to the segmental level. No evidence of pulmonary embolism. Normal heart size. No pericardial effusion. Normal caliber thoracic aorta. No aortic dissection. Mediastinum/Nodes: No enlarged mediastinal, hilar, or axillary lymph nodes. Thyroid gland, trachea, and esophagus demonstrate no significant findings. Small esophageal hiatal hernia. Lungs/Pleura: Mild dependent changes in the lung bases. No focal airspace disease or consolidation. No pleural effusions. No pneumothorax. Airways are patent. Upper Abdomen: No acute abnormality. Musculoskeletal: Degenerative changes in the spine. No destructive bone lesions. Review of the MIP images confirms the above findings. IMPRESSION: No evidence of significant pulmonary embolus. No evidence of active pulmonary disease. Electronically Signed   By: Burman NievesWilliam  Stevens M.D.   On: 07/25/2016 21:22    Cardiac Studies     Patient Profile      66 yo with increasing dyspnea, left arm numbness.  troponins are negative   Assessment & Plan    1.  chest pain :  66 y.o. female presents with 16 hours of chest pain. Despite 16 hours of pain, her troponins remained negative. CT angiogram was  negative for pulmonary embolus.  Echocardiogram has been ordered but will likely not be done for quite a while. I think that she should be able to go home.  She is completely stable .  We can consider an outpatient stress Myoview study and get an OP echo .   OK for DC   Signed, Kristeen MissPhilip Merve Hotard, MD  07/26/2016, 8:50 AM

## 2016-07-26 NOTE — Discharge Summary (Signed)
Discharge Summary    Patient ID: Samantha Haley,  MRN: [id number], DOB/AGE: [birth year] 66 y.o.  Admit date: 07/25/2016 Discharge date: 07/26/2016  Primary Care Provider: Pcp Not In System Primary Cardiologist: New- seen by Dr. Rennis Golden here, will follow in [place].    Discharge Diagnoses    Principal Problem:   Chest pain Active Problems:   Abnormal ECG   HTN (hypertension)   HLD (hyperlipidemia)   Allergies Allergies  Allergen Reactions  . Lipitor [Atorvastatin]     Myalgias   . Lovastatin     myalgias  . Sulfa Antibiotics      History of Present Illness     66 year old female with a history of HTN, HLD, fibromyalgia, chronic LBP and no prior cardiac history who presented on transfer from [facility] with chest pain and concern for EKG changes.   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 She has never smoked cigarettes and despite her chronic back pain is reasonably active, performing water aerobics up to 5 times per week. She was in her usual state of health until earlier this week, when she completed water aerobics and walking getting out of the pool, noted dyspnea. This was unusual for her. She again had some dyspnea on Thursday, March 15 and was awakened by substernal chest heaviness and dyspnea in the early morning hours of March 16. This persisted throughout the morning and became associated with left arm numbness. For these reasons, she presented to a local urgent care in [place]. She was advised that she would need to present to the emergency department. Her friend drove her to the emergency department at [facility] where there was concern for possible ST segment elevation. A code STEMI was called and she was transferred to [facility] for further evaluation. She continued to have nagging chest discomfort throughout the trip to [facility]. Follow-up EKG here showed no evidence of ST segment elevation. Review of EKGs from [facility]  show significant baseline artifact. Areas of concern for ST segment elevation were notable for significant artifact. Code STEMI was called off.    Of note, she has also had headaches, which have been concerning for her as she does not usually have headaches.  Hospital Course     Consultants: none  1. Retrosternal chest pain with dyspnea:  EKG here showed no ST elevation or depression. Initial ECG with code STEMI felt to be artifact. Troponin has remained normal. CT angiogram of the chest reveals no evidence of pulmonary embolus or active pulmonary disease. PPI was added in case related to reflux. Plan was for inpatient echo and discharge home; however, due to high volumes this was not going to be completed for some time. Plan is for outpatient echo with outpatient follow up with one of our cardiologists in [place]. Based on echo findings, a stress test may be ordered at that time.   2. Headache with left arm numbness: Patient has been having headaches and developed left arm numbness in the setting of chest pain and dyspnea. Neurologic exam was unremarkable. Head CT with no acute abnormality. Follow up with PCP  3. Essential hypertension: Blood pressure is stable. She does not appear to be on any medications at home.   4. Hyperlipidemia: She is on low-dose simvastatin therapy. She was previously intolerant to Lipitor and lovastatin. Her primary care is working with her to find a regimen that she tolerates and gets her LDL down. She may require Zetia  on top of her simvastatin.   The patient has had an uncomplicated hospital course and is recovering well.  She has been seen by Dr. Elease Hashimoto today and deemed ready for discharge home. All follow-up appointments have been scheduled. The office will call her to make an apt for an echo.  Discharge medications are listed below.  _____________  Discharge Vitals Blood pressure 118/68, pulse (!) 56, temperature 98.8 F (37.1 C), temperature source Oral,  resp. rate 15, height 5\' 2"  (1.575 m), weight 165 lb 14.4 oz (75.3 kg), SpO2 100 %.  Filed Weights   07/25/16 1421 07/25/16 2257  Weight: 165 lb (74.8 kg) 165 lb 14.4 oz (75.3 kg)    Labs & Radiologic Studies     CBC  Recent Labs  07/25/16 1429 07/25/16 2305  WBC 4.0 5.2  HGB 12.4 12.4  HCT 38.9 38.7  MCV 89.2 89.0  PLT 201 185   Basic Metabolic Panel  Recent Labs  07/25/16 1429 07/25/16 2305 07/26/16 0824  NA 139  --  140  K 3.8  --  4.0  CL 105  --  105  CO2 25  --  27  GLUCOSE 116*  --  98  BUN 12  --  9  CREATININE 0.82 0.89 0.94  CALCIUM 9.4  --  9.5   Liver Function Tests  Recent Labs  07/26/16 0824  AST 22  ALT 19  ALKPHOS 53  BILITOT 0.7  PROT 6.2*  ALBUMIN 3.9    Recent Labs  07/25/16 2305  LIPASE 15   Cardiac Enzymes  Recent Labs  07/25/16 2305 07/26/16 0824  TROPONINI <0.03 <0.03   BNP Invalid input(s): POCBNP D-Dimer No results for input(s): DDIMER in the last 72 hours. Hemoglobin A1C No results for input(s): HGBA1C in the last 72 hours. Fasting Lipid Panel No results for input(s): CHOL, HDL, LDLCALC, TRIG, CHOLHDL, LDLDIRECT in the last 72 hours. Thyroid Function Tests No results for input(s): TSH, T4TOTAL, T3FREE, THYROIDAB in the last 72 hours.  Invalid input(s): FREET3  Dg Chest 2 View  Result Date: 07/25/2016 CLINICAL DATA:  SOB started 2 nights ago, then chest pain and left arm numbness started last night. Feels fatigued and dizzy. Increased blood pressure today. Patient reports history of hypotension and chart documents hypertension. EXAM: CHEST  2 VIEW COMPARISON:  None. FINDINGS: All normal cardiac silhouette. There is mild coarsened central interstitial markings. No pleural effusion. No consolidation. No pneumothorax. The IMPRESSION: No active cardiopulmonary disease. Electronically Signed   By: Genevive Bi M.D.   On: 07/25/2016 15:50   Ct Head Wo Contrast  Result Date: 07/25/2016 CLINICAL DATA:  66 y/o  F;  headache with numbness in the left arm. EXAM: CT HEAD WITHOUT CONTRAST TECHNIQUE: Contiguous axial images were obtained from the base of the skull through the vertex without intravenous contrast. COMPARISON:  None. FINDINGS: Brain: No evidence of acute infarction, hemorrhage, hydrocephalus, extra-axial collection or mass lesion/mass effect. Vascular: Mild calcific atherosclerosis of cavernous and paraclinoid segments of internal carotid arteries. Skull: Normal. Negative for fracture or focal lesion. Sinuses/Orbits: No acute finding. Other: None. IMPRESSION: 1. No acute intracranial abnormality identified. 2. Unremarkable CT of the head. Electronically Signed   By: Mitzi Hansen M.D.   On: 07/25/2016 21:23   Ct Angio Chest Pe W Or Wo Contrast  Result Date: 07/25/2016 CLINICAL DATA:  Chest pain, headache, and shortness of breath starting Wednesday. Numbness in the left arm starting this morning. Nonsmoker. EXAM: CT ANGIOGRAPHY CHEST WITH  CONTRAST TECHNIQUE: Multidetector CT imaging of the chest was performed using the standard protocol during bolus administration of intravenous contrast. Multiplanar CT image reconstructions and MIPs were obtained to evaluate the vascular anatomy. CONTRAST:  60 mL Isovue 370 COMPARISON:  Chest 07/25/2016 FINDINGS: Cardiovascular: Satisfactory opacification of the pulmonary arteries to the segmental level. No evidence of pulmonary embolism. Normal heart size. No pericardial effusion. Normal caliber thoracic aorta. No aortic dissection. Mediastinum/Nodes: No enlarged mediastinal, hilar, or axillary lymph nodes. Thyroid gland, trachea, and esophagus demonstrate no significant findings. Small esophageal hiatal hernia. Lungs/Pleura: Mild dependent changes in the lung bases. No focal airspace disease or consolidation. No pleural effusions. No pneumothorax. Airways are patent. Upper Abdomen: No acute abnormality. Musculoskeletal: Degenerative changes in the spine. No  destructive bone lesions. Review of the MIP images confirms the above findings. IMPRESSION: No evidence of significant pulmonary embolus. No evidence of active pulmonary disease. Electronically Signed   By: Burman Nieves M.D.   On: 07/25/2016 21:22     Diagnostic Studies/Procedures    none _____________    Disposition   Pt is being discharged home today in good condition.  Follow-up Plans & Appointments    Follow-up Information    Prentice Docker, MD. Go on 08/14/2016.   Specialty:  Cardiology Why:  @ 3pm. The office will call you to arrange an echo (ultrasound of your heart) prior to this appointment Contact information: [address] [place] [address] [phone]            Discharge Medications     Medication List    TAKE these medications   aspirin EC 81 MG tablet Take 81 mg by mouth daily.   eszopiclone 1 MG Tabs tablet Commonly known as:  LUNESTA Take 1 mg by mouth at bedtime.   HYDROcodone-acetaminophen 7.5-325 MG tablet Commonly known as:  NORCO Take 1 tablet by mouth every 6 (six) hours as needed for pain.   LINZESS 290 MCG Caps capsule Generic drug:  linaclotide Take 290 mcg by mouth daily. 30 minutes prior to first meal   orphenadrine 100 MG tablet Commonly known as:  NORFLEX Take 100 mg by mouth 2 (two) times daily.   pantoprazole 40 MG tablet Commonly known as:  PROTONIX Take 1 tablet (40 mg total) by mouth daily at 6 (six) AM. Start taking on:  07/27/2016   simvastatin 10 MG tablet Commonly known as:  ZOCOR Take 10 mg by mouth daily at 6 PM.          Outstanding Labs/Studies   echo  Duration of Discharge Encounter   Greater than 30 minutes including physician time.  Signed, Cline Crock PA-C 07/26/2016, 11:32 AM  Attending Note:   The patient was seen and examined.  Agree with assessment and plan as noted above.  Changes made to the above note as needed.  Patient seen and independently examined with  Carlean Jews, PA .   We discussed all aspects of the encounter. I agree with the assessment and plan as stated above.  1. Chest pain :   Atypical,  troponins are negative , will arrange for an OP stress test.  She is stable for DC     I have spent a total of 40 minutes with patient reviewing hospital  notes , telemetry, EKGs, labs and examining patient as well as establishing an assessment and plan that was discussed with the patient. > 50% of time was spent in direct patient care.  Vesta MixerPhilip J. Zoeie Ritter, Montez HagemanJr., MD, [facility]wood Surgical HospitalFACC 07/28/2016, 1:54 PM [address],  [address] Office [phone] Pager [phone]

## 2016-08-14 ENCOUNTER — Encounter: Payer: Self-pay | Admitting: *Deleted

## 2016-08-14 ENCOUNTER — Ambulatory Visit (INDEPENDENT_AMBULATORY_CARE_PROVIDER_SITE_OTHER): Payer: Medicare Other | Admitting: Cardiovascular Disease

## 2016-08-14 ENCOUNTER — Telehealth: Payer: Self-pay | Admitting: Cardiovascular Disease

## 2016-08-14 ENCOUNTER — Encounter: Payer: Self-pay | Admitting: Cardiovascular Disease

## 2016-08-14 VITALS — BP 124/88 | HR 78 | Ht 62.0 in | Wt 163.0 lb

## 2016-08-14 DIAGNOSIS — R0609 Other forms of dyspnea: Secondary | ICD-10-CM | POA: Diagnosis not present

## 2016-08-14 DIAGNOSIS — Z9289 Personal history of other medical treatment: Secondary | ICD-10-CM

## 2016-08-14 DIAGNOSIS — R079 Chest pain, unspecified: Secondary | ICD-10-CM

## 2016-08-14 DIAGNOSIS — R5383 Other fatigue: Secondary | ICD-10-CM

## 2016-08-14 NOTE — Patient Instructions (Signed)
Medication Instructions:  Continue all current medications.  Labwork: none  Testing/Procedures:  Your physician has requested that you have a lexiscan myoview. For further information please visit [email or web address]. Please follow instruction sheet, as given.  Office will contact with results via phone or letter.    Follow-Up: 6 weeks   Any Other Special Instructions Will Be Listed Below (If Applicable).  If you need a refill on your cardiac medications before your next appointment, please call your pharmacy.  

## 2016-08-14 NOTE — Progress Notes (Signed)
SUBJECTIVE: The patient presents for posthospitalization follow-up. She was discharged on 07/26/16. This is my first time meeting her. I reviewed all relevant documentation, labs, studies, and ECGs.  She was hospitalized for retrosternal chest pain with dyspnea and was evaluated by cardiology. The pain reportedly lasted for 16 hours. Troponins were normal. CT angiogram of the chest revealed no evidence of pulmonary embolism. PPI was added.   The plan was for an outpatient echocardiogram and stress test and follow-up with me. She also has a history of hypertension and hyperlipidemia. She had been having headaches and developed left arm numbness with a normal neurologic exam. Head CT showed no acute abnormalities.  Echocardiogram 07/26/16 showed normal left ventricular systolic and diastolic function and normal regional wall motion, LVEF 60-65%.  She tells me that she had been experiencing shortness of breath and fatigue for about one week prior to hospitalization. She has had no further chest pain since being discharged from the hospital but does complain of continued fatigue. She had been exercising in a therapy pool for lower back pain but has not done so since being discharged. Since being started on Protonix, she has noticed more belching.    Review of Systems: As per "subjective", otherwise negative.  Allergies  Allergen Reactions  . Lipitor [Atorvastatin]     Myalgias   . Lovastatin     myalgias  . Sulfa Antibiotics     Current Outpatient Prescriptions  Medication Sig Dispense Refill  . aspirin EC 81 MG tablet Take 81 mg by mouth daily.    . eszopiclone (LUNESTA) 1 MG TABS tablet Take 1 mg by mouth at bedtime.  0  . HYDROcodone-acetaminophen (NORCO) 7.5-325 MG tablet Take 1 tablet by mouth every 6 (six) hours as needed for pain.  0  . LINZESS 290 MCG CAPS capsule Take 290 mcg by mouth daily. 30 minutes prior to first meal  1  . orphenadrine (NORFLEX) 100 MG tablet Take 100  mg by mouth 2 (two) times daily.  2  . pantoprazole (PROTONIX) 40 MG tablet Take 1 tablet (40 mg total) by mouth daily at 6 (six) AM. 30 tablet 3  . simvastatin (ZOCOR) 10 MG tablet Take 10 mg by mouth daily at 6 PM.     No current facility-administered medications for this visit.     Past Medical History:  Diagnosis Date  . Chronic lower back pain   . Constipation   . Fibromyalgia   . Hyperlipidemia   . Hypertension     Past Surgical History:  Procedure Laterality Date  . BACK SURGERY  2001    Social History   Social History  . Marital status: Divorced    Spouse name: N/A  . Number of children: N/A  . Years of education: N/A   Occupational History  . Not on file.   Social History Main Topics  . Smoking status: Never Smoker  . Smokeless tobacco: Never Used  . Alcohol use No  . Drug use: No  . Sexual activity: Not on file   Other Topics Concern  . Not on file   Social History Narrative   Lives in [place], [place].  Lives with a 66 y/o man that she helps to take care of. H2O aerobics 5 d/wk.  Previously was licensed as a Lawyer.     Vitals:   08/14/16 1509  BP: 124/88  Pulse: 78  SpO2: 98%  Weight: 163 lb (73.9 kg)  Height:  (1.575 m)  Wt Readings from Last 3 Encounters:  08/14/16 163 lb (73.9 kg)  07/25/16 165 lb 14.4 oz (75.3 kg)     PHYSICAL EXAM General: NAD HEENT: Normal. Neck: No JVD, no thyromegaly. Lungs: Clear to auscultation bilaterally with normal respiratory effort. CV: Nondisplaced PMI.  Regular rate and rhythm, normal S1/S2, no S3/S4, no murmur. No pretibial or periankle edema.  No carotid bruit.   Abdomen: Soft, nontender, no distention.  Neurologic: Alert and oriented.  Psych: Normal affect. Skin: Normal. Musculoskeletal: No gross deformities.    ECG: Most recent ECG reviewed.   Labs: Lab Results  Component Value Date/Time   K 4.0 07/26/2016 08:24 AM   BUN 9 07/26/2016 08:24 AM   CREATININE 0.94 07/26/2016 08:24 AM     ALT 19 07/26/2016 08:24 AM   HGB 12.4 07/25/2016 11:05 PM     Lipids: No results found for: LDLCALC, LDLDIRECT, CHOL, TRIG, HDL     ASSESSMENT AND PLAN: 1. Chest pain, exertional dyspnea, and fatigue: I will proceed with a nuclear myocardial perfusion imaging study to evaluate for ischemic heart disease (Lexiscan due to low back pain). Continue aspirin and statin.   Disposition: Follow up 6 weeks  Time spent: 40 minutes, of which greater than 50% was spent reviewing symptoms, relevant blood tests and studies, and discussing management plan with the patient.   Prentice Docker, M.D., F.A.C.C.

## 2016-08-14 NOTE — Telephone Encounter (Signed)
Pre-cert Verification for the following procedure   Lexiscan myoview scheduled for 08/29/2016 at [facility]

## 2016-08-29 ENCOUNTER — Encounter (HOSPITAL_COMMUNITY)
Admission: RE | Admit: 2016-08-29 | Discharge: 2016-08-29 | Disposition: A | Discharge status: Home / Self Care | Payer: Medicare Other | Source: Ambulatory Visit | Attending: Cardiovascular Disease | Admitting: Cardiovascular Disease

## 2016-08-29 ENCOUNTER — Encounter (HOSPITAL_COMMUNITY): Payer: Self-pay

## 2016-08-29 ENCOUNTER — Telehealth: Payer: Self-pay

## 2016-08-29 ENCOUNTER — Encounter (HOSPITAL_BASED_OUTPATIENT_CLINIC_OR_DEPARTMENT_OTHER)
Admission: RE | Admit: 2016-08-29 | Discharge: 2016-08-29 | Disposition: A | Discharge status: Home / Self Care | Payer: Medicare Other | Source: Ambulatory Visit | Attending: Cardiovascular Disease | Admitting: Cardiovascular Disease

## 2016-08-29 DIAGNOSIS — R079 Chest pain, unspecified: Secondary | ICD-10-CM | POA: Diagnosis present

## 2016-08-29 LAB — NM MYOCAR MULTI W/SPECT W/WALL MOTION / EF
LV dias vol: 40 mL (ref 46–106)
LV sys vol: 7 mL
Peak HR: 97 {beats}/min
RATE: 0.21
Rest HR: 58 {beats}/min
SDS: 2
SRS: 4
SSS: 6
TID: 0.86

## 2016-08-29 MED ORDER — TECHNETIUM TC 99M TETROFOSMIN IV KIT
10.0000 | PACK | Freq: Once | INTRAVENOUS | Status: AC | PRN
  Administered 2016-08-29: 10 via INTRAVENOUS

## 2016-08-29 MED ORDER — REGADENOSON 0.4 MG/5ML IV SOLN
INTRAVENOUS | Status: AC
  Administered 2016-08-29: 0.4 mg via INTRAVENOUS
  Filled 2016-08-29: qty 5

## 2016-08-29 MED ORDER — SODIUM CHLORIDE 0.9% FLUSH
INTRAVENOUS | Status: AC
  Administered 2016-08-29: 10 mL via INTRAVENOUS
  Filled 2016-08-29: qty 10

## 2016-08-29 MED ORDER — TECHNETIUM TC 99M TETROFOSMIN IV KIT
30.0000 | PACK | Freq: Once | INTRAVENOUS | Status: AC | PRN
  Administered 2016-08-29: 33 via INTRAVENOUS

## 2016-08-29 NOTE — Telephone Encounter (Signed)
-----   Message from Suresh A Koneswaran, MD sent at 08/29/2016  4:15 PM EDT ----- Normal. 

## 2016-08-29 NOTE — Telephone Encounter (Signed)
LMTCB

## 2016-08-29 NOTE — Telephone Encounter (Signed)
Patient notified

## 2016-08-29 NOTE — Telephone Encounter (Signed)
-----   Message from Laqueta Linden, MD sent at 08/29/2016  4:15 PM EDT ----- Normal.

## 2016-09-18 ENCOUNTER — Encounter: Payer: Self-pay | Admitting: Cardiovascular Disease

## 2016-09-18 ENCOUNTER — Ambulatory Visit (INDEPENDENT_AMBULATORY_CARE_PROVIDER_SITE_OTHER): Payer: Medicare Other | Admitting: Cardiovascular Disease

## 2016-09-18 VITALS — BP 140/62 | HR 67 | Ht 62.0 in | Wt 164.0 lb

## 2016-09-18 DIAGNOSIS — R079 Chest pain, unspecified: Secondary | ICD-10-CM | POA: Diagnosis not present

## 2016-09-18 DIAGNOSIS — R0609 Other forms of dyspnea: Secondary | ICD-10-CM | POA: Diagnosis not present

## 2016-09-18 DIAGNOSIS — Z136 Encounter for screening for cardiovascular disorders: Secondary | ICD-10-CM | POA: Diagnosis not present

## 2016-09-18 DIAGNOSIS — E782 Mixed hyperlipidemia: Secondary | ICD-10-CM

## 2016-09-18 DIAGNOSIS — R5383 Other fatigue: Secondary | ICD-10-CM

## 2016-09-18 DIAGNOSIS — K219 Gastro-esophageal reflux disease without esophagitis: Secondary | ICD-10-CM | POA: Diagnosis not present

## 2016-09-18 DIAGNOSIS — IMO0001 Reserved for inherently not codable concepts without codable children: Secondary | ICD-10-CM

## 2016-09-18 NOTE — Patient Instructions (Signed)
Medication Instructions:  Continue all current medications.  Labwork: none  Testing/Procedures: none  Follow-Up: As needed.    Any Other Special Instructions Will Be Listed Below (If Applicable).  If you need a refill on your cardiac medications before your next appointment, please call your pharmacy.  

## 2016-09-18 NOTE — Progress Notes (Signed)
SUBJECTIVE: The patient returns for follow-up after undergoing cardiovascular testing performed for the evaluation of chest pain, exertional dyspnea, and fatigue.  Nuclear stress test on 08/29/16 was normal, LVEF greater than 65%.  She denies chest pain and shortness of breath. She has been undergoing physical therapy for her back and does pool exercises 5 days per week.  She has questions about hyperlipidemia and her current medication.    Review of Systems: As per "subjective", otherwise negative.  Allergies  Allergen Reactions  . Lipitor [Atorvastatin]     Myalgias   . Lovastatin     myalgias  . Sulfa Antibiotics     Current Outpatient Prescriptions  Medication Sig Dispense Refill  . aspirin EC 81 MG tablet Take 81 mg by mouth daily.    . eszopiclone (LUNESTA) 1 MG TABS tablet Take 1 mg by mouth at bedtime.  0  . HYDROcodone-acetaminophen (NORCO) 7.5-325 MG tablet Take 1 tablet by mouth every 6 (six) hours as needed for pain.  0  . LINZESS 290 MCG CAPS capsule Take 290 mcg by mouth daily. 30 minutes prior to first meal  1  . orphenadrine (NORFLEX) 100 MG tablet Take 100 mg by mouth 2 (two) times daily.  2  . pantoprazole (PROTONIX) 40 MG tablet Take 1 tablet (40 mg total) by mouth daily at 6 (six) AM. 30 tablet 3  . simvastatin (ZOCOR) 20 MG tablet Take 20 mg by mouth daily.     No current facility-administered medications for this visit.     Past Medical History:  Diagnosis Date  . Chronic lower back pain   . Constipation   . Fibromyalgia   . Hyperlipidemia   . Hypertension     Past Surgical History:  Procedure Laterality Date  . BACK SURGERY  2001    Social History   Social History  . Marital status: Divorced    Spouse name: N/A  . Number of children: N/A  . Years of education: N/A   Occupational History  . Not on file.   Social History Main Topics  . Smoking status: Never Smoker  . Smokeless tobacco: Never Used  . Alcohol use No  . Drug  use: No  . Sexual activity: Not on file   Other Topics Concern  . Not on file   Social History Narrative   Lives in [place], [place].  Lives with a 66 y/o man that she helps to take care of. H2O aerobics 5 d/wk.  Previously was licensed as a Lawyer.     Vitals:   09/18/16 0933  BP: 140/62  Pulse: 67  SpO2: 99%  Weight: 164 lb (74.4 kg)  Height: 5\' 2"  (1.575 m)    Wt Readings from Last 3 Encounters:  09/18/16 164 lb (74.4 kg)  08/14/16 163 lb (73.9 kg)  07/25/16 165 lb 14.4 oz (75.3 kg)     PHYSICAL EXAM General: NAD HEENT: Normal. Neck: No JVD, no thyromegaly. Lungs: Clear to auscultation bilaterally with normal respiratory effort. CV: Nondisplaced PMI.  Regular rate and rhythm, normal S1/S2, no S3/S4, no murmur. No pretibial or periankle edema.  No carotid bruit.   Abdomen: Soft, nontender, no distention.  Neurologic: Alert and oriented.  Psych: Normal affect. Skin: Normal. Musculoskeletal: No gross deformities.    ECG: Most recent ECG reviewed.   Labs: Lab Results  Component Value Date/Time   K 4.0 07/26/2016 08:24 AM   BUN 9 07/26/2016 08:24 AM   CREATININE 0.94 07/26/2016 08:24  AM   ALT 19 07/26/2016 08:24 AM   HGB 12.4 07/25/2016 11:05 PM     Lipids: No results found for: LDLCALC, LDLDIRECT, CHOL, TRIG, HDL     ASSESSMENT AND PLAN: 1. Chest pain, exertional dyspnea, and fatigue: Normal nuclear stress test as noted above. I spoke to her about the primary prevention of cardiovascular disease including cholesterol and blood pressure control. No further cardiovascular testing is indicated at this time. Her symptoms have resolved.  2. Hyperlipidemia: Continue simvastatin.  3. GERD: Continue Protonix.    Disposition: Follow up prn  Prentice DockerSuresh Shawnay Bramel, M.D., F.A.C.C.

## 2016-12-02 ENCOUNTER — Other Ambulatory Visit: Payer: Self-pay | Admitting: Physician Assistant

## 2017-06-16 ENCOUNTER — Other Ambulatory Visit: Payer: Self-pay | Admitting: Cardiovascular Disease

## 2017-06-16 MED ORDER — PANTOPRAZOLE SODIUM 40 MG PO TBEC: 40.0000 mg | DELAYED_RELEASE_TABLET | Freq: Every day | ORAL | 0 refills | Status: DC

## 2017-06-16 NOTE — Telephone Encounter (Signed)
° ° ° °  1. Which medications need to be refilled? (please list name of each medication and dose if known)  pantoprazole (PROTONIX) 40 MG    2. Which pharmacy/location (including street and city if local pharmacy) is medication to be sent to?  [facility]- [place], [place]    3. Do they need a 30 day or 90 day supply?

## 2017-06-16 NOTE — Telephone Encounter (Signed)
15 day supply sent - pt is on f/u with Dr Purvis SheffieldKoneswaran as needed and further refills should come from pcp

## 2017-07-24 ENCOUNTER — Encounter: Payer: Self-pay | Admitting: Cardiovascular Disease

## 2017-07-24 ENCOUNTER — Other Ambulatory Visit: Payer: Self-pay

## 2017-07-24 ENCOUNTER — Ambulatory Visit (INDEPENDENT_AMBULATORY_CARE_PROVIDER_SITE_OTHER): Payer: Medicare Other | Admitting: Cardiovascular Disease

## 2017-07-24 VITALS — BP 138/81 | HR 80 | Ht 62.0 in | Wt 169.0 lb

## 2017-07-24 DIAGNOSIS — K219 Gastro-esophageal reflux disease without esophagitis: Secondary | ICD-10-CM | POA: Diagnosis not present

## 2017-07-24 DIAGNOSIS — R030 Elevated blood-pressure reading, without diagnosis of hypertension: Secondary | ICD-10-CM | POA: Diagnosis not present

## 2017-07-24 MED ORDER — PANTOPRAZOLE SODIUM 40 MG PO TBEC: 40.0000 mg | DELAYED_RELEASE_TABLET | Freq: Every day | ORAL | 3 refills | Status: DC

## 2017-07-24 NOTE — Progress Notes (Signed)
SUBJECTIVE: The patient presents for follow-up.  I saw her in May 2018.  She underwent a normal nuclear stress test on 08/29/16.  I prescribed Protonix at her last visit.  Her PCP switch practices and she had no way of refilling it.  She went to an urgent care for a rash about a month ago and her systolic blood pressure was 174.  Blood pressure is 162/80 today.  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  Her daughter [place] moving to [place] and wants her mother to move with her but the patient wants to continue taking care of this elderly gentleman until she can no longer do so.  She swims to relax and for exercise and when she checks her blood pressure at [facility] it is often normal.  She has retrosternal chest pressure with symptoms of acid reflux disease.  She has been out of Protonix for several weeks.      Review of Systems: As per "subjective", otherwise negative.  Allergies  Allergen Reactions  . Lipitor [Atorvastatin]     Myalgias   . Lovastatin     myalgias  . Sulfa Antibiotics     Current Outpatient Medications  Medication Sig Dispense Refill  . aspirin EC 81 MG tablet Take 81 mg by mouth daily.    . eszopiclone (LUNESTA) 1 MG TABS tablet Take 1 mg by mouth at bedtime.  0  . HYDROcodone-acetaminophen (NORCO) 7.5-325 MG tablet Take 1 tablet by mouth every 6 (six) hours as needed for pain.  0  . LINZESS 290 MCG CAPS capsule Take 290 mcg by mouth daily. 30 minutes prior to first meal  1  . orphenadrine (NORFLEX) 100 MG tablet Take 100 mg by mouth 2 (two) times daily.  2  . pantoprazole (PROTONIX) 40 MG tablet Take 1 tablet (40 mg total) by mouth daily. At 6 pm. 15 tablet 0   No current facility-administered medications for this visit.     Past Medical History:  Diagnosis Date  . Chronic lower back pain   . Constipation   . Fibromyalgia   . Hyperlipidemia   . Hypertension     Past Surgical History:  Procedure Laterality Date  . BACK  SURGERY  2001    Social History   Socioeconomic History  . Marital status: Divorced    Spouse name: Not on file  . Number of children: Not on file  . Years of education: Not on file  . Highest education level: Not on file  Social Needs  . Financial resource strain: Not on file  . Food insecurity - worry: Not on file  . Food insecurity - inability: Not on file  . Transportation needs - medical: Not on file  . Transportation needs - non-medical: Not on file  Occupational History  . Not on file  Tobacco Use  . Smoking status: Never Smoker  . Smokeless tobacco: Never Used  Substance and Sexual Activity  . Alcohol use: No  . Drug use: No  . Sexual activity: Not on file  Other Topics Concern  . Not on file  Social History Narrative   [place] in [place].  Lives with a 67 y/o man that she helps to take care of. H2O aerobics 5 d/wk.  Previously was licensed as a LawyerCNA.     Vitals:   07/24/17 1311  BP: (!) 162/80  Pulse: 80  SpO2: 99%  Weight: 169 lb (76.7  kg)  Height: 5\' 2"  (1.575 m)    Wt Readings from Last 3 Encounters:  07/24/17 169 lb (76.7 kg)  09/18/16 164 lb (74.4 kg)  08/14/16 163 lb (73.9 kg)     PHYSICAL EXAM General: NAD HEENT: Normal. Neck: No JVD, no thyromegaly. Lungs: Clear to auscultation bilaterally with normal respiratory effort. CV: Regular rate and rhythm, normal S1/S2, no S3/S4, no murmur. No pretibial or periankle edema.  No carotid bruit.   Abdomen: Soft, nontender, no distention.  Neurologic: Alert and oriented.  Psych: Normal affect. Skin: Normal. Musculoskeletal: No gross deformities.    ECG: Most recent ECG reviewed.   Labs: Lab Results  Component Value Date/Time   K 4.0 07/26/2016 08:24 AM   BUN 9 07/26/2016 08:24 AM   CREATININE 0.94 07/26/2016 08:24 AM   ALT 19 07/26/2016 08:24 AM   HGB 12.4 07/25/2016 11:05 PM     Lipids: No results found for: LDLCALC, LDLDIRECT, CHOL, TRIG, HDL     ASSESSMENT AND PLAN: 1.   GERD: I will refill Protonix 40 mg daily to be taken 30-60 minutes before meal.  2.  Elevated blood pressure without a diagnosis of hypertension: I will continue to monitor this.  A recheck of her blood pressure demonstrated stage I hypertension, 138/81.  It is likely being aggravated by stress.    Disposition: Follow up 3 months   Prentice Docker, M.D., F.A.C.C.

## 2017-07-24 NOTE — Patient Instructions (Addendum)
Medication Instructions:   Protonix refilled for 1 year supply.  May take one tab 30-60 minutes daily before a meal.  Continue all other medications.    Labwork: none  Testing/Procedures: none  Follow-Up: 3 months   Any Other Special Instructions Will Be Listed Below (If Applicable).  Primary care list provided today.   Recheck BP was 138/81.    If you need a refill on your cardiac medications before your next appointment, please call your pharmacy.

## 2017-09-10 ENCOUNTER — Encounter: Payer: Self-pay | Admitting: Family Medicine

## 2017-09-10 ENCOUNTER — Encounter (INDEPENDENT_AMBULATORY_CARE_PROVIDER_SITE_OTHER): Payer: Self-pay

## 2017-09-10 ENCOUNTER — Ambulatory Visit (INDEPENDENT_AMBULATORY_CARE_PROVIDER_SITE_OTHER): Payer: Medicare Other | Admitting: Family Medicine

## 2017-09-10 ENCOUNTER — Other Ambulatory Visit: Payer: Self-pay

## 2017-09-10 VITALS — BP 136/72 | HR 77 | Temp 98.6°F | Resp 12 | Ht 62.0 in | Wt 168.0 lb

## 2017-09-10 DIAGNOSIS — Z1231 Encounter for screening mammogram for malignant neoplasm of breast: Secondary | ICD-10-CM | POA: Diagnosis not present

## 2017-09-10 DIAGNOSIS — E785 Hyperlipidemia, unspecified: Secondary | ICD-10-CM | POA: Diagnosis not present

## 2017-09-10 DIAGNOSIS — F419 Anxiety disorder, unspecified: Secondary | ICD-10-CM

## 2017-09-10 DIAGNOSIS — Z23 Encounter for immunization: Secondary | ICD-10-CM

## 2017-09-10 DIAGNOSIS — R499 Unspecified voice and resonance disorder: Secondary | ICD-10-CM | POA: Diagnosis not present

## 2017-09-10 DIAGNOSIS — Z1239 Encounter for other screening for malignant neoplasm of breast: Secondary | ICD-10-CM

## 2017-09-10 MED ORDER — TETANUS-DIPHTHERIA TOXOIDS TD 5-2 LFU IM INJ: 0.5000 mL | INJECTION | Freq: Once | INTRAMUSCULAR | Status: DC

## 2017-09-10 MED ORDER — ESCITALOPRAM OXALATE 10 MG PO TABS
10.0000 mg | ORAL_TABLET | Freq: Every day | ORAL | 1 refills | Status: DC
Start: 2017-09-10 — End: 2017-10-15

## 2017-09-10 MED ORDER — TETANUS-DIPHTHERIA TOXOIDS TD 5-2 LFU IM INJ: 0.5000 mL | INJECTION | Freq: Once | INTRAMUSCULAR | 0 refills | Status: DC
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
## 2017-09-10 NOTE — Patient Instructions (Addendum)
Start the lexapro each day as directed Follow up with ENT doctor about your voice change Stop the pantaprazole/protonix  Request records from PCP Come back fasting at your physical for blood work.

## 2017-10-15 ENCOUNTER — Other Ambulatory Visit: Payer: Self-pay

## 2017-10-15 ENCOUNTER — Ambulatory Visit (INDEPENDENT_AMBULATORY_CARE_PROVIDER_SITE_OTHER): Payer: Medicare Other | Admitting: Family Medicine

## 2017-10-15 ENCOUNTER — Encounter: Payer: Self-pay | Admitting: Family Medicine

## 2017-10-15 ENCOUNTER — Other Ambulatory Visit (HOSPITAL_COMMUNITY)
Admission: RE | Admit: 2017-10-15 | Discharge: 2017-10-15 | Disposition: A | Discharge status: Home / Self Care | Payer: Medicare Other | Source: Ambulatory Visit | Attending: Family Medicine | Admitting: Family Medicine

## 2017-10-15 ENCOUNTER — Ambulatory Visit (HOSPITAL_COMMUNITY)
Admission: RE | Admit: 2017-10-15 | Discharge: 2017-10-15 | Disposition: A | Discharge status: Home / Self Care | Payer: Medicare Other | Source: Ambulatory Visit | Attending: Family Medicine | Admitting: Family Medicine

## 2017-10-15 VITALS — BP 106/70 | HR 70 | Temp 98.8°F | Resp 14 | Ht 62.0 in | Wt 163.1 lb

## 2017-10-15 DIAGNOSIS — Z124 Encounter for screening for malignant neoplasm of cervix: Secondary | ICD-10-CM | POA: Insufficient documentation

## 2017-10-15 DIAGNOSIS — E785 Hyperlipidemia, unspecified: Secondary | ICD-10-CM

## 2017-10-15 DIAGNOSIS — Z0000 Encounter for general adult medical examination without abnormal findings: Secondary | ICD-10-CM | POA: Diagnosis not present

## 2017-10-15 DIAGNOSIS — R5383 Other fatigue: Secondary | ICD-10-CM | POA: Diagnosis not present

## 2017-10-15 DIAGNOSIS — Z113 Encounter for screening for infections with a predominantly sexual mode of transmission: Secondary | ICD-10-CM

## 2017-10-15 DIAGNOSIS — Q799 Congenital malformation of musculoskeletal system, unspecified: Secondary | ICD-10-CM

## 2017-10-15 DIAGNOSIS — F419 Anxiety disorder, unspecified: Secondary | ICD-10-CM | POA: Diagnosis not present

## 2017-10-15 MED ORDER — ESCITALOPRAM OXALATE 10 MG PO TABS: 10.0000 mg | ORAL_TABLET | Freq: Every day | ORAL | 1 refills | Status: DC
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
## 2017-10-16 ENCOUNTER — Encounter: Payer: Self-pay | Admitting: Family Medicine

## 2017-10-16 LAB — CBC WITH DIFFERENTIAL/PLATELET
Basophils Absolute: 42 {cells}/uL (ref 0–200)
Basophils Relative: 1.1 %
Eosinophils Absolute: 42 {cells}/uL (ref 15–500)
Eosinophils Relative: 1.1 %
HCT: 42 % (ref 35.0–45.0)
Hemoglobin: 14 g/dL (ref 11.7–15.5)
Lymphs Abs: 2117 {cells}/uL (ref 850–3900)
MCH: 28.9 pg (ref 27.0–33.0)
MCHC: 33.3 g/dL (ref 32.0–36.0)
MCV: 86.6 fL (ref 80.0–100.0)
MPV: 10.1 fL (ref 7.5–12.5)
Monocytes Relative: 9.6 %
Neutro Abs: 1235 {cells}/uL — ABNORMAL LOW (ref 1500–7800)
Neutrophils Relative %: 32.5 %
Platelets: 238 10*3/uL (ref 140–400)
RBC: 4.85 Million/uL (ref 3.80–5.10)
RDW: 12.8 % (ref 11.0–15.0)
Total Lymphocyte: 55.7 %
WBC mixed population: 365 {cells}/uL (ref 200–950)
WBC: 3.8 10*3/uL (ref 3.8–10.8)

## 2017-10-16 LAB — URINE CYTOLOGY ANCILLARY ONLY
Chlamydia: NEGATIVE
NEISSERIA GONORRHEA: NEGATIVE
Trichomonas: NEGATIVE

## 2017-10-16 LAB — HEPATITIS C ANTIBODY
Hepatitis C Ab: NONREACTIVE
SIGNAL TO CUT-OFF: 0.01

## 2017-10-16 LAB — COMPLETE METABOLIC PANEL WITHOUT GFR
AG Ratio: 2 (calc) (ref 1.0–2.5)
ALT: 13 U/L (ref 6–29)
AST: 19 U/L (ref 10–35)
Albumin: 4.6 g/dL (ref 3.6–5.1)
Alkaline phosphatase (APISO): 55 U/L (ref 33–130)
BUN: 18 mg/dL (ref 7–25)
CO2: 25 mmol/L (ref 20–32)
Calcium: 10 mg/dL (ref 8.6–10.4)
Chloride: 107 mmol/L (ref 98–110)
Creat: 0.94 mg/dL (ref 0.50–0.99)
GFR, Est African American: 73 mL/min/{1.73_m2}
GFR, Est Non African American: 63 mL/min/{1.73_m2}
Globulin: 2.3 g/dL (ref 1.9–3.7)
Glucose, Bld: 94 mg/dL (ref 65–99)
Potassium: 4.3 mmol/L (ref 3.5–5.3)
Sodium: 141 mmol/L (ref 135–146)
Total Bilirubin: 0.7 mg/dL (ref 0.2–1.2)
Total Protein: 6.9 g/dL (ref 6.1–8.1)

## 2017-10-16 LAB — HIV ANTIBODY (ROUTINE TESTING W REFLEX): HIV 1&2 Ab, 4th Generation: NONREACTIVE

## 2017-10-16 LAB — LIPID PANEL
Cholesterol: 345 mg/dL — ABNORMAL HIGH (ref <200)
HDL: 78 mg/dL (ref >50)
LDL Cholesterol (Calc): 249 mg/dL (calc) — ABNORMAL HIGH
NON-HDL CHOLESTEROL (CALC): 267 mg/dL — AB (ref <130)
Total CHOL/HDL Ratio: 4.4 (calc) (ref <5.0)
Triglycerides: 69 mg/dL (ref <150)

## 2017-10-16 LAB — SYPHILIS: RPR W/REFLEX TO RPR TITER AND TREPONEMAL ANTIBODIES, TRADITIONAL SCREENING AND DIAGNOSIS ALGORITHM: RPR Ser Ql: NONREACTIVE

## 2017-10-16 LAB — TSH: TSH: 2.13 m[IU]/L (ref 0.40–4.50)

## 2017-10-16 LAB — VITAMIN D 25 HYDROXY (VIT D DEFICIENCY, FRACTURES): Vit D, 25-Hydroxy: 19 ng/mL — ABNORMAL LOW (ref 30–100)

## 2017-10-19 ENCOUNTER — Ambulatory Visit (INDEPENDENT_AMBULATORY_CARE_PROVIDER_SITE_OTHER): Payer: Medicare Other | Admitting: Otolaryngology

## 2017-10-19 DIAGNOSIS — R490 Dysphonia: Secondary | ICD-10-CM | POA: Diagnosis not present

## 2017-10-19 DIAGNOSIS — K219 Gastro-esophageal reflux disease without esophagitis: Secondary | ICD-10-CM | POA: Diagnosis not present

## 2017-10-19 LAB — CYTOLOGY - PAP: Diagnosis: NEGATIVE

## 2017-10-19 LAB — URINE CYTOLOGY ANCILLARY ONLY
BACTERIAL VAGINITIS: POSITIVE — AB
CANDIDA VAGINITIS: NEGATIVE

## 2017-10-20 ENCOUNTER — Telehealth: Payer: Self-pay | Admitting: Family Medicine

## 2017-10-20 ENCOUNTER — Encounter: Payer: Self-pay | Admitting: Family Medicine

## 2017-10-20 DIAGNOSIS — M799 Soft tissue disorder, unspecified: Secondary | ICD-10-CM

## 2017-10-20 MED ORDER — METRONIDAZOLE 500 MG PO TABS: 500.0000 mg | ORAL_TABLET | Freq: Two times a day (BID) | ORAL | 0 refills | Status: DC

## 2017-10-20 NOTE — Telephone Encounter (Signed)
Please call patient and advise that her tests did reveal that she has a bacterial vaginal infection called bacterial vaginosis.  Bacterial vaginosis is caused by an overgrowth of bacteria in the vaginal area. Advise that she will need to take an antibiotic twice a day for the next seven days, called Metronidazole 500 mg bid for 7 days. Advise that she should not drink any alcohol while taking this medication or within three days of stopping this medication or it will make her sick.  Medication sent o pharmacy.   Advise that her cholesterol was very high! She needs to follow up to discuss this. In addition, her other labs looked good and her pap smear was normal. There was no evidence of any atypical or cancerous cells.  Please advise to follow up to discuss labs. Sent in flagly to pharmacy.

## 2017-10-20 NOTE — Telephone Encounter (Signed)
See below note. In addition, advise that the xray shows that it is most likely not bone but enlargement in the soft tissue, possible a cyst or lipoma. We need to get an ultrasound. The elevated area is not a lesion in the bone, so we need to get the ultrasound. I have ordered it.

## 2017-10-21 NOTE — Telephone Encounter (Signed)
Spoke with patient and advised her of lab results with verbal understanding.

## 2017-10-22 ENCOUNTER — Encounter (HOSPITAL_COMMUNITY): Payer: Self-pay

## 2017-10-22 ENCOUNTER — Ambulatory Visit (HOSPITAL_COMMUNITY)
Admission: RE | Admit: 2017-10-22 | Discharge: 2017-10-22 | Disposition: A | Discharge status: Home / Self Care | Payer: Medicare Other | Source: Ambulatory Visit | Attending: Family Medicine | Admitting: Family Medicine

## 2017-10-22 DIAGNOSIS — Z1231 Encounter for screening mammogram for malignant neoplasm of breast: Secondary | ICD-10-CM | POA: Insufficient documentation

## 2017-10-22 DIAGNOSIS — Z1239 Encounter for other screening for malignant neoplasm of breast: Secondary | ICD-10-CM

## 2017-10-28 ENCOUNTER — Telehealth: Payer: Self-pay | Admitting: Family Medicine

## 2017-10-28 NOTE — Telephone Encounter (Signed)
Patient came by to have medical records release form filled out to have her lab work sent to [facility] [place] for workers comp. Purposes.

## 2017-10-30 ENCOUNTER — Telehealth: Payer: Self-pay

## 2017-10-30 NOTE — Telephone Encounter (Signed)
LVM regarding [facility] appt at [facility]  Appt Date: Thursday 11/05/17 Time 1:30pm  Arrive at 1:15pm to register To cancel or reschedule call [phone]

## 2017-11-05 ENCOUNTER — Ambulatory Visit (HOSPITAL_COMMUNITY)
Admission: RE | Admit: 2017-11-05 | Discharge: 2017-11-05 | Disposition: A | Discharge status: Home / Self Care | Payer: Medicare Other | Source: Ambulatory Visit | Attending: Family Medicine | Admitting: Family Medicine

## 2017-11-05 DIAGNOSIS — M799 Soft tissue disorder, unspecified: Secondary | ICD-10-CM | POA: Insufficient documentation

## 2017-11-06 ENCOUNTER — Telehealth: Payer: Self-pay | Admitting: Family Medicine

## 2017-11-06 DIAGNOSIS — M25511 Pain in right shoulder: Secondary | ICD-10-CM

## 2017-11-06 DIAGNOSIS — M6740 Ganglion, unspecified site: Secondary | ICD-10-CM

## 2017-11-06 NOTE — Telephone Encounter (Signed)
Advised patient of results with verbal understanding.  

## 2017-11-06 NOTE — Telephone Encounter (Signed)
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  This basically means that there is a cyst in 1 of the muscles of the rotator cuff that is extending into the bursa of her shoulder.  She needs to follow-up with orthopedics.  I will place a referral for her at this time with [facility] in [place].

## 2017-11-11 ENCOUNTER — Encounter (INDEPENDENT_AMBULATORY_CARE_PROVIDER_SITE_OTHER): Payer: Self-pay | Admitting: Orthopaedic Surgery

## 2017-11-11 ENCOUNTER — Ambulatory Visit (INDEPENDENT_AMBULATORY_CARE_PROVIDER_SITE_OTHER): Payer: Medicare Other | Admitting: Orthopaedic Surgery

## 2017-11-11 DIAGNOSIS — G8929 Other chronic pain: Secondary | ICD-10-CM

## 2017-11-11 DIAGNOSIS — M25511 Pain in right shoulder: Secondary | ICD-10-CM | POA: Diagnosis not present

## 2017-11-11 NOTE — Progress Notes (Signed)
Office Visit Note   Patient: Samantha Haley           Date of Birth: 1950/10/08           MRN: [id number] Visit Date: 11/11/2017              Requested by: Aliene Beams, MD [address] [place], [place] [address] PCP: Aliene Beams, MD   Assessment & Plan:  1. Chronic right shoulder pain     Plan: Right shoulder pain is presently asymptomatic.  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  Films of the shoulder were negative for any acute changes.  On discussion with Mrs. Porras over approximately 45 minutes discussing x-rays, ultrasound physical exam.  50% of the time was in counseling .at this point I would not suggest any specific treatment as she has full range of motion and no pain.  No masses were palpated.  I think it is worth watching issue with her shoulder and consider an MRI scan if she were to have any pain.  There is no evidence of any active problem or surgical lesion  Follow-Up Instructions: Return if symptoms worsen or fail to improve.   Orders:  No orders of the defined types were placed in this encounter.  No orders of the defined types were placed in this encounter.     Procedures: No procedures performed   Clinical Data: No additional findings.   Subjective: No chief complaint on file. [name] is 67 years old and visited the office for evaluation of problem with her right shoulder.  She recently had a primary care physician evaluation and was noted to have a small "mass" in the area of her right shoulder.  Subsequent workup included x-rays which revealed an os acromiale which is not symptomatic.  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  Mrs.  Duford has not been symptomatic either before or since.  She is very active and does not have any compromise of any of her activities or sleeping.  She has not had any numbness or tinglingor  issues with her cervical spine. [name] is quite active with exercises and has not had any pain or limitation of activities.  She has been disabled from an on-the-job injury  HPI  Review of Systems   Objective: Vital Signs: There were no vitals taken for this visit.  Physical Exam  Constitutional: She is oriented to person, place, and time. She appears well-developed and well-nourished.  HENT:  Mouth/Throat: Oropharynx is clear and moist.  Eyes: Pupils are equal, round, and reactive to light. EOM are normal.  Pulmonary/Chest: Effort normal.  Neurological: She is alert and oriented to person, place, and time.  Skin: Skin is warm and dry.  Psychiatric: She has a normal mood and affect. Her behavior is normal.    Ortho Exam and oriented x3.  Comfortable sitting.  Full range of motion of right shoulder without evidence of instability or use of capsulitis.  Negative impingement.  Negative empty can testing.  Skin intact.  No masses palpated.  No difference in appearance of right shoulder compared to left.  Biceps intact.  Good grip and good release.  Good strength.  Specialty Comments:  No specialty comments available.  Imaging:  No results found.   PMFS History: Patient Active Problem List   Diagnosis Date Noted  . Chest pain 07/25/2016  . Abnormal ECG 07/25/2016  . HTN (hypertension) 07/25/2016  . HLD (hyperlipidemia) 07/25/2016   Past Medical History:  Diagnosis Date  . Abnormal electrocardiogram (ECG) (EKG)   . Chest pain   . Chronic lower back pain   . Constipation   . Fibromyalgia   . Hyperlipidemia   . Hypertension     Family History  Problem Relation Age of Onset  . Hypertension Mother   . Hyperlipidemia Mother   . Hernia Mother   . Dementia Mother   . Obesity  Mother   . Alzheimer's disease Father   . Diabetes Father   . Hypertension Father   . Dementia Father   . Hypertension Sister   . Rheumatic fever Sister   . Obesity Sister   . Obesity Brother   . Obesity Sister   . Obesity Sister   . Hyperlipidemia Sister   . Hypertension Sister   . Obesity Brother   . Hypertension Brother   . Diabetes Brother   . Breast cancer Maternal Aunt     Past Surgical History:  Procedure Laterality Date  . BACK SURGERY Left 2001  . CARPAL TUNNEL RELEASE     Social History   Occupational History  . Not on file  Tobacco Use  . Smoking status: Never Smoker  . Smokeless tobacco: Never Used  Substance and Sexual Activity  . Alcohol use: No  . Drug use: No  . Sexual activity: Not Currently     Valeria BatmanPeter W Ieesha Abbasi, MD   Note - This record has been created using [employer] software.  Chart creation errors have been sought, but may not always  have been located. Such creation errors do not reflect on  the standard of medical care.

## 2017-11-18 ENCOUNTER — Ambulatory Visit (INDEPENDENT_AMBULATORY_CARE_PROVIDER_SITE_OTHER): Payer: Medicare Other | Admitting: Cardiovascular Disease

## 2017-11-18 ENCOUNTER — Encounter: Payer: Self-pay | Admitting: Cardiovascular Disease

## 2017-11-18 VITALS — BP 118/72 | HR 78 | Ht 62.0 in | Wt 163.0 lb

## 2017-11-18 DIAGNOSIS — E7800 Pure hypercholesterolemia, unspecified: Secondary | ICD-10-CM

## 2017-11-18 MED ORDER — PITAVASTATIN CALCIUM 2 MG PO TABS: 2.0000 mg | ORAL_TABLET | Freq: Every day | ORAL | 6 refills | Status: DC

## 2017-11-18 NOTE — Patient Instructions (Signed)
Medication Instructions:   Begin Livalo 2mg  daily.  Continue all other medications.    Labwork: Lipids - will mail reminder when time.  Testing/Procedures: none  Follow-Up: Your physician wants you to follow up in: 6 months.  You will receive a reminder letter in the mail one-two months in advance.  If you don't receive a letter, please call our office to schedule the follow up appointment   Any Other Special Instructions Will Be Listed Below (If Applicable).  If you need a refill on your cardiac medications before your next appointment, please call your pharmacy.
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
## 2017-12-03 ENCOUNTER — Ambulatory Visit (INDEPENDENT_AMBULATORY_CARE_PROVIDER_SITE_OTHER): Payer: Medicare Other | Admitting: Otolaryngology

## 2017-12-07 ENCOUNTER — Ambulatory Visit (INDEPENDENT_AMBULATORY_CARE_PROVIDER_SITE_OTHER): Payer: Medicare Other | Admitting: Otolaryngology

## 2017-12-11 ENCOUNTER — Telehealth: Payer: Self-pay | Admitting: *Deleted

## 2017-12-11 MED ORDER — SIMVASTATIN 40 MG PO TABS: 40.0000 mg | ORAL_TABLET | Freq: Every day | ORAL | Status: DC

## 2017-12-11 NOTE — Telephone Encounter (Signed)
[employer] declined to cover the Livalo.  Wanted her to have tried Rosuvastatin, Pravastatin, or Ezetimibe.    Patient made aware & stated she received letter in mail as well.  Stated that her pmd has put her back on the Simvastatin at 40mg  daily instead of the 20mg  she previously had.

## 2017-12-16 ENCOUNTER — Ambulatory Visit: Payer: Medicare Other | Admitting: Family Medicine

## 2018-01-15 ENCOUNTER — Ambulatory Visit: Payer: Medicare Other | Admitting: Family Medicine

## 2018-03-24 ENCOUNTER — Encounter: Payer: Self-pay | Admitting: *Deleted

## 2018-04-21 ENCOUNTER — Telehealth: Payer: Self-pay | Admitting: Cardiovascular Disease

## 2018-04-21 NOTE — Telephone Encounter (Signed)
[facility].  Please call [phone] .

## 2018-04-21 NOTE — Telephone Encounter (Signed)
Notes recorded by Lesle ChrisHill, Shaindel Sweeten G, LPN on 69/62/952812/03/2018 at 11:58 AM EST Patient notified. Copy to pmd. Follow up scheduled for 06/14/18 at 1:00 in [place] office with Corine ShelterLuke Kilroy, PA. She prefers to wait on starting Repatha or Praluent till she discusses with provider first. ------  Notes recorded by Lesle ChrisHill, Chigozie Basaldua G, LPN on 41/3/244012/01/2018 at 6:33 PM EST Left message to return call.  ------  Notes recorded by Lesle ChrisHill, Jennilee Demarco G, LPN on 10/27/253611/26/2019 at 2:45 PM EST Left message to return call.  ------  Notes recorded by Azalee CourseMeng, Hao, PA on 04/04/2018 at 9:22 PM EST Covering for Dr. Purvis SheffieldKoneswaran, patient was turned down by insurance for Livalo, she has been taking 40mg  of simvastatin. Based on outside lab, her total cholesterol and LDL (bad cholesterol) have significantly improved, however they are still high. Recommend refer to lipid clinic for consideration of PCSK 9 inhibitor such as Repatha or Praluent

## 2018-06-14 ENCOUNTER — Telehealth: Payer: Self-pay | Admitting: Pharmacist

## 2018-06-14 ENCOUNTER — Ambulatory Visit (INDEPENDENT_AMBULATORY_CARE_PROVIDER_SITE_OTHER): Payer: Medicare Other | Admitting: Cardiology

## 2018-06-14 ENCOUNTER — Encounter: Payer: Self-pay | Admitting: Cardiology

## 2018-06-14 DIAGNOSIS — M545 Low back pain, unspecified: Secondary | ICD-10-CM

## 2018-06-14 DIAGNOSIS — G8929 Other chronic pain: Secondary | ICD-10-CM | POA: Diagnosis not present

## 2018-06-14 DIAGNOSIS — E785 Hyperlipidemia, unspecified: Secondary | ICD-10-CM | POA: Diagnosis not present

## 2018-06-14 DIAGNOSIS — M549 Dorsalgia, unspecified: Secondary | ICD-10-CM

## 2018-06-14 MED ORDER — ROSUVASTATIN CALCIUM 20 MG PO TABS: 20.0000 mg | ORAL_TABLET | Freq: Every day | ORAL | 3 refills | Status: DC

## 2018-06-14 NOTE — Assessment & Plan Note (Signed)
Markedly elevated LDL (249) when untreated. Better with statin Rx but she suffers from side effects related to statin Rx
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
## 2018-06-14 NOTE — Telephone Encounter (Signed)
LMOM to discuss lipid management. Pt has tried high intensity atorvastatin which resulted in severe muscle cramps, Livalo (which was cost prohibitive) and continues on simvastatin 40mg  daily despite cramping and muscle aches. LDL is >250 untreated, but was 133 on most recent lipid panel. She is primary prevention.   I do not see that we have tried rosuvastatin. This may be an option and will pursue this first before pursuing PSCK9i therapy.

## 2018-06-14 NOTE — Assessment & Plan Note (Signed)
Followed in a pain clinic

## 2018-06-14 NOTE — Telephone Encounter (Signed)
Spoke with patient about options she is willing to try rosuvastatin therapy. She is hesitant about injections mostly due to cost. Advised we could pursue this if unable to tolerate rosuvastatin, but this is a more reasonable option at this point.   Advised she have a 2 week statin holiday to allow her to feel better from cramping on simvastatin then start rosuvastatin 20mg  daily. She will call if she has any issues tolerating therapy.

## 2018-06-14 NOTE — Patient Instructions (Signed)
Medication Instructions:  Your physician recommends that you continue on your current medications as directed. Please refer to the Current Medication list given to you today.  If you need a refill on your cardiac medications before your next appointment, please call your pharmacy.   Lab work: NONE  If you have labs (blood work) drawn today and your tests are completely normal, you will receive your results only by: . MyChart Message (if you have MyChart) OR . A paper copy in the mail If you have any lab test that is abnormal or we need to change your treatment, we will call you to review the results.  Testing/Procedures: NONE   Follow-Up: At [facility], you and your health needs are our priority.  As part of our continuing mission to provide you with exceptional heart care, we have created designated Provider Care Teams.  These Care Teams include your primary Cardiologist (physician) and Advanced Practice Providers (APPs -  Physician Assistants and Nurse Practitioners) who all work together to provide you with the care you need, when you need it. You will need a follow up appointment in 3 months.  Please call our office 2 months in advance to schedule this appointment.  You may see Suresh Koneswaran, MD or one of the following Advanced Practice Providers on your designated Care Team:   Brittany Strader, PA-C ([place] Office) . Michele Lenze, PA-C ([place] Office)  Any Other Special Instructions Will Be Listed Below (If Applicable). Thank you for choosing [facility]!     

## 2018-09-08 ENCOUNTER — Telehealth: Payer: Self-pay | Admitting: *Deleted

## 2018-09-08 NOTE — Telephone Encounter (Signed)
   Primary Cardiologist:  Prentice Docker, MD   Patient contacted.  History reviewed.  No symptoms to suggest any unstable cardiac conditions.  Based on discussion, with current pandemic situation, we will be postponing this appointment for Samantha Haley with a plan for f/u on 12/08/2018.  If symptoms change, she has been instructed to contact our office.      Marland Kitchen

## 2018-09-13 ENCOUNTER — Ambulatory Visit: Payer: Medicare Other | Admitting: Cardiovascular Disease

## 2018-09-21 ENCOUNTER — Other Ambulatory Visit (HOSPITAL_COMMUNITY): Payer: Self-pay | Admitting: *Deleted

## 2018-09-21 DIAGNOSIS — Z1231 Encounter for screening mammogram for malignant neoplasm of breast: Secondary | ICD-10-CM

## 2018-11-04 ENCOUNTER — Other Ambulatory Visit: Payer: Self-pay

## 2018-11-04 ENCOUNTER — Encounter (HOSPITAL_COMMUNITY): Payer: Self-pay

## 2018-11-04 ENCOUNTER — Ambulatory Visit (HOSPITAL_COMMUNITY)
Admission: RE | Admit: 2018-11-04 | Discharge: 2018-11-04 | Disposition: A | Discharge status: Home / Self Care | Payer: Medicare Other | Source: Ambulatory Visit | Attending: *Deleted | Admitting: *Deleted

## 2018-11-04 DIAGNOSIS — Z1231 Encounter for screening mammogram for malignant neoplasm of breast: Secondary | ICD-10-CM | POA: Insufficient documentation

## 2018-12-08 ENCOUNTER — Ambulatory Visit: Payer: Medicare Other | Admitting: Cardiovascular Disease

## 2019-11-11 ENCOUNTER — Other Ambulatory Visit (HOSPITAL_COMMUNITY): Payer: Self-pay | Admitting: Family

## 2019-11-11 DIAGNOSIS — Z1231 Encounter for screening mammogram for malignant neoplasm of breast: Secondary | ICD-10-CM

## 2019-11-23 ENCOUNTER — Other Ambulatory Visit: Payer: Self-pay

## 2019-11-23 ENCOUNTER — Ambulatory Visit (HOSPITAL_COMMUNITY)
Admission: RE | Admit: 2019-11-23 | Discharge: 2019-11-23 | Disposition: A | Discharge status: Home / Self Care | Payer: Medicare Other | Source: Ambulatory Visit | Attending: Family | Admitting: Family

## 2019-11-23 ENCOUNTER — Other Ambulatory Visit (HOSPITAL_COMMUNITY): Payer: Self-pay | Admitting: Family

## 2019-11-23 DIAGNOSIS — Z1231 Encounter for screening mammogram for malignant neoplasm of breast: Secondary | ICD-10-CM | POA: Diagnosis present

## 2019-11-24 ENCOUNTER — Other Ambulatory Visit (HOSPITAL_COMMUNITY): Payer: Self-pay | Admitting: Family

## 2020-03-22 ENCOUNTER — Ambulatory Visit (INDEPENDENT_AMBULATORY_CARE_PROVIDER_SITE_OTHER): Payer: Medicare Other | Admitting: Family Medicine

## 2020-03-22 ENCOUNTER — Encounter: Payer: Self-pay | Admitting: Family Medicine

## 2020-03-22 VITALS — BP 112/64 | HR 70 | Ht 62.0 in | Wt 136.0 lb

## 2020-03-22 DIAGNOSIS — E782 Mixed hyperlipidemia: Secondary | ICD-10-CM

## 2020-03-22 DIAGNOSIS — Z789 Other specified health status: Secondary | ICD-10-CM | POA: Diagnosis not present

## 2020-03-22 NOTE — Progress Notes (Signed)
Cardiology Office Note  Date: 03/22/2020   ID: Samantha Haley, Samantha Haley 1950-10-23, MRN [id number]  PCP:  Jessee Avers, FNP  Cardiologist:  No primary care provider on file. Electrophysiologist:  None   Chief Complaint: Follow-up shortness of breath, atrial fibrillation. History of hypertension, hyperlipidemia, abnormal EKG, chest pain  History of Present Illness: Samantha Haley is a 69 y.o. female with a history of hypertension, hyperlipidemia, abnormal EKG, chest pain, chronic lower back pain.  Last seen by Kerin Ransom, PA on June 14, 2018 in follow-up for hyperlipidemia. Previous presentation to [facility] March 2018 with chest pain with code STEMI activation.  She was transferred to [facility].  Code STEMI was canceled.  Her troponins were negative.  Chest CTA was negative for PE.  EKG revealed very old artifact.  Echo showed normal LV function.  Outpatient nuclear stress test was low risk.  She has been having problems with dyslipidemia and issues with intolerance to statin medications giving her cramps in legs and hands.  She was back on simvastatin.  Lipid panel in January 2020 revealed LDL 133.  Liver function and renal function were normal on lab work.  There was mention of possibly starting a PCSK9 inhibitor.  Recent cholesterol panel drawn on 11/11/2019 at [facility] family practice total cholesterol 351, triglyceride 53, HDL 82, VLDL 6, LDL of 243.  Patient has been intolerant of statins in the past as mentioned above.  She is here today as referral by primary care provider.  Recent LDL as mentioned above was 243.  She denies recent anginal or exertional symptoms, palpitations or arrhythmias, orthostatic symptoms, PND, orthopnea, CVA or TIA-like symptoms, claudication-like symptoms, DVT or PE-like symptoms, or lower extremity edema.  Blood pressure is within normal limits today 112/64.  Heart rate 70.  EKG normal sinus rhythm with a rate of 70.  Patient states she has lost a  lot of weight in an attempt to help with her lipid levels.  She states this apparently has not worked.  Past Medical History:  Diagnosis Date  . Abnormal electrocardiogram (ECG) (EKG)   . Chest pain   . Chronic lower back pain   . Constipation   . Fibromyalgia   . Hyperlipidemia   . Hypertension     Past Surgical History:  Procedure Laterality Date  . BACK SURGERY Left 2001  . CARPAL TUNNEL RELEASE      Current Outpatient Medications  Medication Sig Dispense Refill  . aspirin EC 81 MG tablet Take 81 mg by mouth daily.    Marland Kitchen escitalopram (LEXAPRO) 5 MG tablet Take 5 mg by mouth daily.    Marland Kitchen HYDROcodone-acetaminophen (NORCO) 7.5-325 MG tablet Take 1 tablet by mouth every 6 (six) hours as needed for pain.  0  . LINZESS 290 MCG CAPS capsule Take 290 mcg by mouth daily. 30 minutes prior to first meal  1  . methocarbamol (ROBAXIN) 500 MG tablet Take 500 mg by mouth 2 (two) times daily.    . Multiple Vitamins-Minerals (MULTIVITAMIN ADULT PO) Take 1 tablet by mouth daily.    Marland Kitchen NARCAN 4 MG/0.1ML LIQD nasal spray kit Place 1 spray into the nose as directed.  0  . orphenadrine (NORFLEX) 100 MG tablet Take 100 mg by mouth 2 (two) times daily.  2  . Vitamin D, Ergocalciferol, (DRISDOL) 1.25 MG (50000 UT) CAPS capsule TAKE 1 CAPSULE BY MOUTH ONCE A WEEK TO TREAT VITAMIN D DEFICIENCY     No current facility-administered medications  for this visit.   Allergies:  Sulfa antibiotics, Lipitor [atorvastatin], Lovastatin, and Pneumococcal vaccine   Social History: The patient  reports that she has never smoked. She has never used smokeless tobacco. She reports that she does not drink alcohol and does not use drugs.   Family History: The patient's family history includes Alzheimer's disease in her father; Breast cancer in her maternal aunt; Dementia in her father and mother; Diabetes in her brother and father; Hernia in her mother; Hyperlipidemia in her mother and sister; Hypertension in her brother,  father, mother, sister, and sister; Obesity in her brother, brother, mother, sister, sister, and sister; Rheumatic fever in her sister.   ROS:  Please see the history of present illness. Otherwise, complete review of systems is positive for none.  All other systems are reviewed and negative.   Physical Exam: VS:  BP 112/64   Pulse 70   Ht _0  (1.575 m)   Wt 136 lb (61.7 kg)   SpO2 96%   BMI 24.87 kg/m , BMI Body mass index is 24.87 kg/m.  Wt Readings from Last 3 Encounters:  03/22/20 136 lb (61.7 kg)  06/14/18 158 lb (71.7 kg)  11/18/17 163 lb (73.9 kg)    General: Patient appears comfortable at rest. Neck: Supple, no elevated JVP or carotid bruits, no thyromegaly. Lungs: Clear to auscultation, nonlabored breathing at rest. Cardiac: Regular rate and rhythm, no S3 or significant systolic murmur, no pericardial rub. Extremities: No pitting edema, distal pulses 2+. Skin: Warm and dry. Musculoskeletal: No kyphosis. Neuropsychiatric: Alert and oriented x3, affect grossly appropriate.  ECG:  An ECG dated 03/22/2020 was personally reviewed today and demonstrated:  Normal sinus rhythm rate of 70.  Recent Labwork: No results found for requested labs within last 8760 hours.     Component Value Date/Time   CHOL 345 (H) 10/15/2017 1132   TRIG 69 10/15/2017 1132   HDL 78 10/15/2017 1132   CHOLHDL 4.4 10/15/2017 1132   LDLCALC 249 (H) 10/15/2017 1132    Other Studies Reviewed Today:  Echocardiogram 07/26/2016 Study Conclusions   - Left ventricle: The cavity size was normal. Systolic function was  normal. The estimated ejection fraction was in the range of 60%  to 65%. Wall motion was normal; there were no regional wall  motion abnormalities. Left ventricular diastolic function  parameters were normal.  - Mitral valve: Calcified annulus. Minimal calcification. There was  trivial regurgitation. Valve area by pressure half-time: 1.41  cm^2.  - Pulmonic valve: There  was trivial regurgitation.    Assessment and Plan:  1. Mixed hyperlipidemia   2. Statin intolerance    1. Mixed hyperlipidemia Recent lipid panel 11/11/2019 at PCP office: Total cholesterol 331, triglycerides 53, HDL 82, VLDL 6, LDL 243.  Will refer to lipid clinic for possible initiation of PCSK9 inhibitor.  2. Statin intolerance Previous statin intolerances.  Atorvastatin and simvastatin  Medication Adjustments/Labs and Tests Ordered: Current medicines are reviewed at length with the patient today.  Concerns regarding medicines are outlined above.   Disposition: Follow-up with Dr. Harl Bowie or APP 6 months  Signed, Levell July, NP 03/22/2020 2:52 PM    [facility] [place], [place], [place] [address] Phone: [phone]; Fax: [phone]

## 2020-03-22 NOTE — Patient Instructions (Addendum)
Medication Instructions:    Your physician recommends that you continue on your current medications as directed. Please refer to the Current Medication list given to you today.  Labwork:  None  Testing/Procedures:  None  Follow-Up:  Your physician recommends that you schedule a follow-up appointment in: 6 months with the new cardiologist.  You have been referred to the Lipid Clinic.  Any Other Special Instructions Will Be Listed Below (If Applicable).  If you need a refill on your cardiac medications before your next appointment, please call your pharmacy.

## 2020-06-18 ENCOUNTER — Other Ambulatory Visit: Payer: Self-pay

## 2020-06-18 ENCOUNTER — Encounter: Payer: Self-pay | Admitting: Internal Medicine

## 2020-06-18 ENCOUNTER — Ambulatory Visit (INDEPENDENT_AMBULATORY_CARE_PROVIDER_SITE_OTHER): Payer: Medicare Other | Admitting: Internal Medicine

## 2020-06-18 VITALS — BP 124/66 | HR 76 | Ht 62.0 in | Wt 137.0 lb

## 2020-06-18 DIAGNOSIS — M791 Myalgia, unspecified site: Secondary | ICD-10-CM | POA: Diagnosis not present

## 2020-06-18 DIAGNOSIS — Z888 Allergy status to other drugs, medicaments and biological substances status: Secondary | ICD-10-CM | POA: Diagnosis not present

## 2020-06-18 DIAGNOSIS — T466X5A Adverse effect of antihyperlipidemic and antiarteriosclerotic drugs, initial encounter: Secondary | ICD-10-CM | POA: Diagnosis not present

## 2020-06-18 DIAGNOSIS — E7849 Other hyperlipidemia: Secondary | ICD-10-CM

## 2020-06-18 MED ORDER — REPATHA SURECLICK 140 MG/ML ~~LOC~~ SOAJ: 1.0000 | SUBCUTANEOUS | 11 refills | Status: DC

## 2020-06-18 NOTE — Patient Instructions (Signed)
Medication Instructions:  Dr. Rennis Golden recommends Repatha 140mg /mL (PCSK9). This is an injectable cholesterol medication self-administered once every 14 days. This medication will likely need prior approval with your insurance company, which we will work on. If the medication is not approved initially, we may need to do an appeal with your insurance.   Administer medication in area of fatty tissue such as abdomen, outer thigh, back of upper arm - and rotate site with each injection Store medication in refrigerator until ready to administer - allow to sit at room temp for 30 mins - 1 hour prior to injection Dispose of medication in a SHARPS container - your pharmacy should be able to direct you on this and proper disposal   If you need co-pay assistance grant, please look into the program at healthwellfoundation.[email or web address] >> disease funds >> hypercholesterolemia. This is an online application or you can call to complete. Once approved, you will provide the "pharmacy card" information to your pharmacy and they will deduct the co-pays from this grant.  If you need a co-pay card for Repatha: >> paying for Repatha or red box that says "Repatha Copay Card" in top right If you need a co-pay card for Praluent: [email or web address] >> starting & paying for Praluent  *If you need a refill on your cardiac medications before your next appointment, please call your pharmacy*   Lab Work: FASTING lipid panel - complete about 1 week before your next visit with Dr. [email or web address]   If you have labs (blood work) drawn today and your tests are completely normal, you will receive your results only by: Rennis Golden MyChart Message (if you have MyChart) OR . A paper copy in the mail If you have any lab test that is abnormal or we need to change your treatment, we will call you to review the results.   Testing/Procedures: Dr. Marland Kitchen has ordered a CT coronary calcium score. This test is done at [address]. This is $99  out of pocket.   Coronary CalciumScan A coronary calcium scan is an imaging test used to look for deposits of calcium and other fatty materials (plaques) in the inner lining of the blood vessels of the heart (coronary arteries). These deposits of calcium and plaques can partly clog and narrow the coronary arteries without producing any symptoms or warning signs. This puts a person at risk for a heart attack. This test can detect these deposits before symptoms develop. Tell a health care provider about:  Any allergies you have.  All medicines you are taking, including vitamins, herbs, eye drops, creams, and over-the-counter medicines.  Any problems you or family members have had with anesthetic medicines.  Any blood disorders you have.  Any surgeries you have had.  Any medical conditions you have.  Whether you are pregnant or may be pregnant. What are the risks? Generally, this is a safe procedure. However, problems may occur, including:  Harm to a pregnant woman and her unborn baby. This test involves the use of radiation. Radiation exposure can be dangerous to a pregnant woman and her unborn baby. If you are pregnant, you generally should not have this procedure done.  Slight increase in the risk of cancer. This is because of the radiation involved in the test. What happens before the procedure? No preparation is needed for this procedure. What happens during the procedure?  You will undress and remove any jewelry around your neck or chest.  You will put on a hospital gown.  Sticky electrodes will be placed on your chest. The electrodes will be connected to an electrocardiogram (ECG) machine to record a tracing of the electrical activity of your heart.  A CT scanner will take pictures of your heart. During this time, you will be asked to lie still and hold your breath for 2-3 seconds while a picture of your heart is being taken. The procedure may vary among health care providers  and hospitals. What happens after the procedure?  You can get dressed.  You can return to your normal activities.  It is up to you to get the results of your test. Ask your health care provider, or the department that is doing the test, when your results will be ready. Summary  A coronary calcium scan is an imaging test used to look for deposits of calcium and other fatty materials (plaques) in the inner lining of the blood vessels of the heart (coronary arteries).  Generally, this is a safe procedure. Tell your health care provider if you are pregnant or may be pregnant.  No preparation is needed for this procedure.  A CT scanner will take pictures of your heart.  You can return to your normal activities after the scan is done. This information is not intended to replace advice given to you by your health care provider. Make sure you discuss any questions you have with your health care provider. Document Released: 10/25/2007 Document Revised: 03/17/2016 Document Reviewed: 03/17/2016 Elsevier Interactive Patient Education  2017 [employer].  Follow-Up: At [facility], you and your health needs are our priority.  As part of our continuing mission to provide you with exceptional heart care, we have created designated Provider Care Teams.  These Care Teams include your primary Cardiologist (physician) and Advanced Practice Providers (APPs -  Physician Assistants and Nurse Practitioners) who all work together to provide you with the care you need, when you need it.  We recommend signing up for the patient portal called "MyChart".  Sign up information is provided on this After Visit Summary.  MyChart is used to connect with patients for Virtual Visits (Telemedicine).  Patients are able to view lab/test results, encounter notes, upcoming appointments, etc.  Non-urgent messages can be sent to your provider as well.   To learn more about what you can do with MyChart, go to  [email or web address].    Your next appointment:   4 month(s) - lipid clinic  The format for your next appointment:   In Person  Provider:   K. Italy Hilty, MD   Other Instructions

## 2020-06-18 NOTE — Progress Notes (Signed)
LIPID CLINIC CONSULT NOTE  Chief Complaint:  Manage dyslipidemia  Primary Care Physician: Jessee Avers, FNP  Primary Cardiologist:  No primary care provider on file.  HPI:  Samantha Haley is a 70 y.o. female who is being seen today for the evaluation of dyslipidemia at the request of Levell July, NP.  This is a pleasant 70 year old female kindly referred for evaluation and management of dyslipidemia.  Previous cardiology patient of Dr. Bronson Ing and has been seen most recently by Levell July, NP in [place].  She actually has no known history of coronary disease.  In 2018 apparently she had EKG changes concerning for acute STEMI and was sent urgently to the Cath Lab however never underwent a catheterization as it was not felt to be true.  She did have stress testing which was negative for ischemia.  She was having symptoms concerning for unstable angina.  She had a chest CT which was negative for PE.  No mention of coronary calcium was noted.  She has had longstanding issue with dyslipidemia and statin intolerance including leg and hand cramping.  She has tried simvastatin, atorvastatin, lovastatin and rosuvastatin, all of which caused myalgias and rash/allergic reaction.  There is a family history of heart disease including her father who died at age 38 of coronary disease.  Her most recent lipid profile showed total cholesterol 331, triglycerides 53, HDL 82 and LDL 243.  This is of course concerning for possible familial hyperlipidemia.  Also of note she says she is lost about 50 pounds over the past year and noted no significant improvement in her lipids.  She made significant dietary changes as well.  She is fairly sedentary.  PMHx:  Past Medical History:  Diagnosis Date  . Abnormal electrocardiogram (ECG) (EKG)   . Chest pain   . Chronic lower back pain   . Constipation   . Fibromyalgia   . Hyperlipidemia   . Hypertension     Past Surgical History:  Procedure Laterality Date   . BACK SURGERY Left 2001  . CARPAL TUNNEL RELEASE      FAMHx:  Family History  Problem Relation Age of Onset  . Hypertension Mother   . Hyperlipidemia Mother   . Hernia Mother   . Dementia Mother   . Obesity Mother   . Alzheimer's disease Father   . Diabetes Father   . Hypertension Father   . Dementia Father   . Hypertension Sister   . Rheumatic fever Sister   . Obesity Sister   . Obesity Brother   . Obesity Sister   . Obesity Sister   . Hyperlipidemia Sister   . Hypertension Sister   . Obesity Brother   . Hypertension Brother   . Diabetes Brother   . Breast cancer Maternal Aunt     SOCHx:   reports that she has never smoked. She has never used smokeless tobacco. She reports that she does not drink alcohol and does not use drugs.  ALLERGIES:  Allergies  Allergen Reactions  . Sulfa Antibiotics Rash and Swelling    Throat swells  . Lipitor [Atorvastatin]     Myalgias   . Lovastatin     myalgias Other reaction(s): Unknown  . Pneumococcal Vaccine Hives    ROS: Pertinent items noted in HPI and remainder of comprehensive ROS otherwise negative.  HOME MEDS: Current Outpatient Medications on File Prior to Visit  Medication Sig Dispense Refill  . aspirin EC 81 MG tablet Take 81 mg by  mouth daily.    Marland Kitchen escitalopram (LEXAPRO) 5 MG tablet Take 5 mg by mouth daily.    Marland Kitchen HYDROcodone-acetaminophen (NORCO) 7.5-325 MG tablet Take 1 tablet by mouth every 6 (six) hours as needed for pain.  0  . LINZESS 290 MCG CAPS capsule Take 290 mcg by mouth daily. 30 minutes prior to first meal  1  . methocarbamol (ROBAXIN) 500 MG tablet Take 500 mg by mouth 2 (two) times daily.    . Multiple Vitamins-Minerals (MULTIVITAMIN ADULT PO) Take 1 tablet by mouth daily.    Marland Kitchen NARCAN 4 MG/0.1ML LIQD nasal spray kit Place 1 spray into the nose as directed.  0  . Vitamin D, Ergocalciferol, (DRISDOL) 1.25 MG (50000 UT) CAPS capsule TAKE 1 CAPSULE BY MOUTH ONCE A WEEK TO TREAT VITAMIN D DEFICIENCY      No current facility-administered medications on file prior to visit.    LABS/IMAGING: No results found for this or any previous visit (from the past 48 hour(s)). No results found.  LIPID PANEL:    Component Value Date/Time   CHOL 345 (H) 10/15/2017 1132   TRIG 69 10/15/2017 1132   HDL 78 10/15/2017 1132   CHOLHDL 4.4 10/15/2017 1132   LDLCALC 249 (H) 10/15/2017 1132    WEIGHTS: Wt Readings from Last 3 Encounters:  06/18/20 137 lb (62.1 kg)  03/22/20 136 lb (61.7 kg)  06/14/18 158 lb (71.7 kg)    VITALS: BP 124/66 (BP Location: Left Arm, Patient Position: Sitting, Cuff Size: Normal)   Pulse 76   Ht 5' 2"  (1.575 m)   Wt 137 lb (62.1 kg)   BMI 25.06 kg/m   EXAM: General appearance: alert and no distress Neck: no carotid bruit, no JVD and thyroid not enlarged, symmetric, no tenderness/mass/nodules Lungs: clear to auscultation bilaterally Heart: regular rate and rhythm Abdomen: soft, non-tender; bowel sounds normal; no masses,  no organomegaly Extremities: extremities normal, atraumatic, no cyanosis or edema Pulses: 2+ and symmetric Skin: Skin color, texture, turgor normal. No rashes or lesions Neurologic: Grossly normal Psych: Pleasant   *Examination chaperoned by Sheral Apley, RN  EKG: Deferred  ASSESSMENT: 1. Mixed dyslipidemia, possible familial hyperlipidemia 2. Coronary artery disease in her father 3. Multistatin intolerance-myalgias/allergic rash  PLAN: 1.   [name] has a mixed dyslipidemia with LDL well over 190 concerning for possible familial hyperlipidemia.  This is also concerning because she had significant weight loss and dietary changes yet her cholesterol has not come down.  There is coronary artery disease in her father however not premature onset.  Unfortunately she cannot tolerate a number of statins in the past due to myalgias and rash.  I would recommend that we start her on a PCSK9 inhibitor.  We will start Repatha 140 mg every 2 weeks.   In addition would be helpful to consider genetic testing.  She has a number of siblings and 2 children who could from genetic information if an abnormal allele was identified.  Would also recommend calcium scoring although none was seen in the past, specific dedicated test would help [place] further restratify her.  Finally we provided dietary information for her.  Plan repeat lipids in about 3 to 4 months and follow-up with me at that time.  Thanks as always for the kind referral.  Pixie Casino, MD, FACC, [place] Director of the Advanced Lipid Disorders &  Cardiovascular Risk Reduction Clinic Diplomate of the American Board of Clinical Lipidology Attending Cardiologist  Direct Dial: [phone]  Fax: [phone]  Website:  [email or web address] 06/18/2020, 4:37 PM

## 2020-06-19 ENCOUNTER — Telehealth: Payer: Self-pay | Admitting: Internal Medicine

## 2020-06-19 NOTE — Telephone Encounter (Signed)
PA for Repatha submitted PA Case: [id number], Status: Approved, Coverage Starts on: 06/19/2020 12:00:00 AM, Coverage Ends on: 12/16/2020 12:00:00 AM  Patient aware of med approval, Rx sent to pharmacy 06/18/20

## 2020-06-22 ENCOUNTER — Telehealth: Payer: Self-pay | Admitting: Internal Medicine

## 2020-06-22 NOTE — Telephone Encounter (Signed)
Left message for patient regarding the 07/19/20 10:00 am scheduled appointment for the calcium scoring ordered by Dr. Rennis Golden.  Arrival time is 9:30 am--[address], [address]----will mail information to patient  And requested she call with questions or concerns.

## 2020-06-28 ENCOUNTER — Telehealth: Payer: Self-pay | Admitting: Internal Medicine

## 2020-06-28 NOTE — Telephone Encounter (Signed)
Patient is approved for grant from [employer]well foundation from 05/22/2020 - 05/21/2021  Pharmacy Card ID: [id number] PC Group: [id number] PC Bin: [id number] PC PCN: [id number] PC Processor: PDMI

## 2020-07-11 ENCOUNTER — Telehealth: Payer: Self-pay | Admitting: Internal Medicine

## 2020-07-11 NOTE — Telephone Encounter (Signed)
Patient called with genetic test results - familial hypercholesterolemia panel  Per MD review, probable FH, but variant not described  Will mail patient a copy of results

## 2020-07-19 ENCOUNTER — Other Ambulatory Visit: Payer: Self-pay

## 2020-07-19 ENCOUNTER — Ambulatory Visit (INDEPENDENT_AMBULATORY_CARE_PROVIDER_SITE_OTHER)
Admission: RE | Admit: 2020-07-19 | Discharge: 2020-07-19 | Disposition: A | Discharge status: Home / Self Care | Payer: Self-pay | Source: Ambulatory Visit | Attending: Internal Medicine | Admitting: Internal Medicine

## 2020-07-19 DIAGNOSIS — E7849 Other hyperlipidemia: Secondary | ICD-10-CM

## 2020-09-09 NOTE — Progress Notes (Signed)
Cardiology Office Note  Date: 09/10/2020   ID: Samantha, Haley 01/04/1951, MRN [id number]  PCP:  Jessee Avers, FNP  Cardiologist:  No primary care provider on file. Electrophysiologist:  None   Chief Complaint: Follow-up shortness of breath, atrial fibrillation. History of hypertension, hyperlipidemia, abnormal EKG, chest pain  History of Present Illness: Samantha Haley is a 70 y.o. female with a history of hypertension, hyperlipidemia, abnormal EKG, chest pain, chronic lower back pain.  Last seen by Kerin Ransom, PA on June 14, 2018 in follow-up for hyperlipidemia. Previous presentation to [facility] March 2018 with chest pain with code STEMI activation.  She was transferred to [facility].  Code STEMI was canceled.  Her troponins were negative.  Chest CTA was negative for PE.  EKG revealed very old artifact.  Echo showed normal LV function.  Outpatient nuclear stress test was low risk.  She has been having problems with dyslipidemia and issues with intolerance to statin medications giving her cramps in legs and hands.  She was back on simvastatin.  Lipid panel in January 2020 revealed LDL 133.  Liver function and renal function were normal on lab work.  There was mention of possibly starting a PCSK9 inhibitor.  Recent cholesterol panel drawn on 11/11/2019 at [facility] family practice : total cholesterol 351, triglyceride 53, HDL 82, VLDL 6, LDL of 243.  Patient has been intolerant of statins in the past as mentioned above.  She was seen by Dr Debara Pickett at lipid clinic 06/18/2020 . It was recommended she start PCSK9 inhibitor. Plans were to start Repatha 140 mg q 2 weeks. In addition it was recommended genetic testing be performed. Also recommended calcium scoring for further risk re-stratification. To recheck lipids in 3 months. CT for Coronary calcium score of 0. This is a low risk study. Mild aortic atherosclerosis.   She is here for follow-up today she has no complaints.  She  denies any issues other than some tingling in her fingers and toes.  She states this has been occurring on and off for several months.  She denies any anginal or exertional symptoms, palpitations or arrhythmias, lightheadedness, dizziness, shortness of breath or DOE.  Denies any PND, orthopnea, CVA or TIA-like symptoms.  Denies any bleeding issues, claudication, DVT or PE-like symptoms, or lower extremity edema.  Appears to be tolerating Repatha well.  She has follow-up lab work requisition with her and is scheduled to have lab work/lipid panel and follow-up with Dr. Debara Pickett soon.   Past Medical History:  Diagnosis Date  . Abnormal electrocardiogram (ECG) (EKG)   . Chest pain   . Chronic lower back pain   . Constipation   . Fibromyalgia   . Hyperlipidemia   . Hypertension     Past Surgical History:  Procedure Laterality Date  . BACK SURGERY Left 2001  . CARPAL TUNNEL RELEASE      Current Outpatient Medications  Medication Sig Dispense Refill  . aspirin EC 81 MG tablet Take 81 mg by mouth daily.    Marland Kitchen escitalopram (LEXAPRO) 5 MG tablet Take 5 mg by mouth daily.    . Evolocumab (REPATHA SURECLICK) 492 MG/ML SOAJ Inject 1 Dose into the skin every 14 (fourteen) days. 2 mL 11  . HYDROcodone-acetaminophen (NORCO) 7.5-325 MG tablet Take 1 tablet by mouth every 6 (six) hours as needed for pain.  0  . LINZESS 290 MCG CAPS capsule Take 290 mcg by mouth daily. 30 minutes prior to first meal  1  .  methocarbamol (ROBAXIN) 500 MG tablet Take 500 mg by mouth 2 (two) times daily.    . Multiple Vitamins-Minerals (MULTIVITAMIN ADULT PO) Take 1 tablet by mouth daily.    Marland Kitchen NARCAN 4 MG/0.1ML LIQD nasal spray kit Place 1 spray into the nose as directed.  0  . Vitamin D, Ergocalciferol, (DRISDOL) 1.25 MG (50000 UT) CAPS capsule TAKE 1 CAPSULE BY MOUTH ONCE A WEEK TO TREAT VITAMIN D DEFICIENCY     No current facility-administered medications for this visit.   Allergies:  Sulfa antibiotics, Lipitor  [atorvastatin], Lovastatin, and Pneumococcal vaccine   Social History: The patient  reports that she has never smoked. She has never used smokeless tobacco. She reports that she does not drink alcohol and does not use drugs.   Family History: The patient's family history includes Alzheimer's disease in her father; Breast cancer in her maternal aunt; Dementia in her father and mother; Diabetes in her brother and father; Hernia in her mother; Hyperlipidemia in her mother and sister; Hypertension in her brother, father, mother, sister, and sister; Obesity in her brother, brother, mother, sister, sister, and sister; Rheumatic fever in her sister.   ROS:  Please see the history of present illness. Otherwise, complete review of systems is positive for none.  All other systems are reviewed and negative.   Physical Exam: VS:  BP (!) 114/54   Pulse 69   Ht 5' 2"  (1.575 m)   Wt 138 lb (62.6 kg)   SpO2 97%   BMI 25.24 kg/m , BMI Body mass index is 25.24 kg/m.  Wt Readings from Last 3 Encounters:  09/10/20 138 lb (62.6 kg)  06/18/20 137 lb (62.1 kg)  03/22/20 136 lb (61.7 kg)    General: Patient appears comfortable at rest. Neck: Supple, no elevated JVP or carotid bruits, no thyromegaly. Lungs: Clear to auscultation, nonlabored breathing at rest. Cardiac: Regular rate and rhythm, no S3 or significant systolic murmur, no pericardial rub. Extremities: No pitting edema, distal pulses 2+. Skin: Warm and dry. Musculoskeletal: No kyphosis. Neuropsychiatric: Alert and oriented x3, affect grossly appropriate.  ECG:  An ECG dated 03/22/2020 was personally reviewed today and demonstrated:  Normal sinus rhythm rate of 70.  Recent Labwork: No results found for requested labs within last 8760 hours.     Component Value Date/Time   CHOL 345 (H) 10/15/2017 1132   TRIG 69 10/15/2017 1132   HDL 78 10/15/2017 1132   CHOLHDL 4.4 10/15/2017 1132   LDLCALC 249 (H) 10/15/2017 1132    Other Studies  Reviewed Today:   CT for Coronary artery Calcium Scoring 07/19/2020 IMPRESSION: Coronary calcium score of 0. This is a low risk study. Mild aortic atherosclerosis.    Echocardiogram 07/26/2016 Study Conclusions   - Left ventricle: The cavity size was normal. Systolic function was  normal. The estimated ejection fraction was in the range of 60%  to 65%. Wall motion was normal; there were no regional wall  motion abnormalities. Left ventricular diastolic function  parameters were normal.  - Mitral valve: Calcified annulus. Minimal calcification. There was  trivial regurgitation. Valve area by pressure half-time: 1.41  cm^2.  - Pulmonic valve: There was trivial regurgitation.    Assessment and Plan:  1. Familial hyperlipidemia   2. Essential hypertension   3. Statin intolerance    1. Mixed hyperlipidemia Recent lipid panel 11/11/2019 at PCP office: Total cholesterol 331, triglycerides 53, HDL 82, VLDL 6, LDL 243.  Recently saw Dr. Debara Pickett and was started on PCSK9 inhibitor  Repatha.  140 mg every 2 weeks.  She has upcoming follow-up lipid panel later this month.  She will follow-up with Dr. Debara Pickett 1 June.  She denies any significant side effects from Repatha use.  Continue aspirin 81 mg daily.  Continue Repatha 140 mg subcu q. 14 days.  2. Essential Hypertension BP today 114/54.  Not on antihypertensive therapy currently  3. Statin intolerance Previous statin intolerances.  Atorvastatin and simvastatin  Medication Adjustments/Labs and Tests Ordered: Current medicines are reviewed at length with the patient today.  Concerns regarding medicines are outlined above.   Disposition: Follow-up with Dr. Harl Bowie or APP 6 months  Signed, Levell July, NP 09/10/2020 9:09 AM    [facility] [place], [place], [place] [address] Phone: [phone]; Fax: [phone]

## 2020-09-10 ENCOUNTER — Encounter: Payer: Self-pay | Admitting: Family Medicine

## 2020-09-10 ENCOUNTER — Other Ambulatory Visit: Payer: Self-pay

## 2020-09-10 ENCOUNTER — Ambulatory Visit (INDEPENDENT_AMBULATORY_CARE_PROVIDER_SITE_OTHER): Payer: Medicare Other | Admitting: Family Medicine

## 2020-09-10 VITALS — BP 114/54 | HR 69 | Ht 62.0 in | Wt 138.0 lb

## 2020-09-10 DIAGNOSIS — I10 Essential (primary) hypertension: Secondary | ICD-10-CM | POA: Diagnosis not present

## 2020-09-10 DIAGNOSIS — Z789 Other specified health status: Secondary | ICD-10-CM

## 2020-09-10 DIAGNOSIS — E7849 Other hyperlipidemia: Secondary | ICD-10-CM | POA: Diagnosis not present

## 2020-09-10 NOTE — Patient Instructions (Signed)
Your physician recommends that you schedule a follow-up appointment in: 6 MONTHS WITH MD  Your physician recommends that you continue on your current medications as directed. Please refer to the Current Medication list given to you today.  Thank you for choosing [facility]!!      

## 2020-10-01 LAB — LIPID PANEL
Chol/HDL Ratio: 2.7 ratio (ref 0.0–4.4)
Cholesterol, Total: 230 mg/dL — ABNORMAL HIGH (ref 100–199)
HDL: 86 mg/dL (ref >39)
LDL Chol Calc (NIH): 133 mg/dL — ABNORMAL HIGH (ref 0–99)
Triglycerides: 62 mg/dL (ref 0–149)
VLDL Cholesterol Cal: 11 mg/dL (ref 5–40)

## 2020-10-10 ENCOUNTER — Encounter: Payer: Self-pay | Admitting: Internal Medicine

## 2020-10-10 ENCOUNTER — Ambulatory Visit (INDEPENDENT_AMBULATORY_CARE_PROVIDER_SITE_OTHER): Payer: Medicare Other | Admitting: Internal Medicine

## 2020-10-10 ENCOUNTER — Other Ambulatory Visit: Payer: Self-pay

## 2020-10-10 VITALS — BP 116/62 | HR 74 | Ht 62.0 in | Wt 136.6 lb

## 2020-10-10 DIAGNOSIS — T466X5A Adverse effect of antihyperlipidemic and antiarteriosclerotic drugs, initial encounter: Secondary | ICD-10-CM

## 2020-10-10 DIAGNOSIS — M791 Myalgia, unspecified site: Secondary | ICD-10-CM

## 2020-10-10 DIAGNOSIS — T466X5D Adverse effect of antihyperlipidemic and antiarteriosclerotic drugs, subsequent encounter: Secondary | ICD-10-CM

## 2020-10-10 DIAGNOSIS — E7849 Other hyperlipidemia: Secondary | ICD-10-CM | POA: Diagnosis not present

## 2020-10-10 NOTE — Patient Instructions (Signed)
Medication Instructions:  Your physician recommends that you continue on your current medications as directed. Please refer to the Current Medication list given to you today.  *If you need a refill on your cardiac medications before your next appointment, please call your pharmacy*   Lab Work: FASTING lab work in 1 year to check cholesterol -- please complete about 1 week before your next visit   If you have labs (blood work) drawn today and your tests are completely normal, you will receive your results only by: Marland Kitchen MyChart Message (if you have MyChart) OR . A paper copy in the mail If you have any lab test that is abnormal or we need to change your treatment, we will call you to review the results.   Testing/Procedures: NONE   Follow-Up: At [facility], you and your health needs are our priority.  As part of our continuing mission to provide you with exceptional heart care, we have created designated Provider Care Teams.  These Care Teams include your primary Cardiologist (physician) and Advanced Practice Providers (APPs -  Physician Assistants and Nurse Practitioners) who all work together to provide you with the care you need, when you need it.  We recommend signing up for the patient portal called "MyChart".  Sign up information is provided on this After Visit Summary.  MyChart is used to connect with patients for Virtual Visits (Telemedicine).  Patients are able to view lab/test results, encounter notes, upcoming appointments, etc.  Non-urgent messages can be sent to your provider as well.   To learn more about what you can do with MyChart, go to [email or web address].    Your next appointment:   12 month(s) - lipid clinic  The format for your next appointment:   In Person or Virtual  Provider:   K. Italy Hilty, MD   Other Instructions

## 2020-10-10 NOTE — Progress Notes (Signed)
LIPID CLINIC CONSULT NOTE  Chief Complaint:  Follow-up dyslipidemia  Primary Care Physician: Jessee Avers, FNP  Primary Cardiologist:  None  HPI:  Samantha Haley is a 70 y.o. female who is being seen today for the evaluation of dyslipidemia at the request of Levell July, NP.  This is a pleasant 70 year old female kindly referred for evaluation and management of dyslipidemia.  Previous cardiology patient of Dr. Bronson Ing and has been seen most recently by Levell July, NP in [place].  She actually has no known history of coronary disease.  In 2018 apparently she had EKG changes concerning for acute STEMI and was sent urgently to the Cath Lab however never underwent a catheterization as it was not felt to be true.  She did have stress testing which was negative for ischemia.  She was having symptoms concerning for unstable angina.  She had a chest CT which was negative for PE.  No mention of coronary calcium was noted.  She has had longstanding issue with dyslipidemia and statin intolerance including leg and hand cramping.  She has tried simvastatin, atorvastatin, lovastatin and rosuvastatin, all of which caused myalgias and rash/allergic reaction.  There is a family history of heart disease including her father who died at age 42 of coronary disease.  Her most recent lipid profile showed total cholesterol 331, triglycerides 53, HDL 82 and LDL 243.  This is of course concerning for possible familial hyperlipidemia.  Also of note she says she is lost about 50 pounds over the past year and noted no significant improvement in her lipids.  She made significant dietary changes as well.  She is fairly sedentary.  10/10/2020  Samantha Haley returns today for follow-up.  She is doing very well on Repatha.  She has had marked improvement on her lipids.  Total cholesterol now 230, HDL 86, triglycerides 62 and LDL of 133, down from 243.  Fortunately her coronary calcium score was 0 although she has a  strong family history of heart disease.  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  As of now she has achieved greater than 50% reduction in LDL cholesterol and she is satisfied with that due to lack of side effects.   PMHx:  Past Medical History:  Diagnosis Date  . Abnormal electrocardiogram (ECG) (EKG)   . Chest pain   . Chronic lower back pain   . Constipation   . Fibromyalgia   . Hyperlipidemia   . Hypertension     Past Surgical History:  Procedure Laterality Date  . BACK SURGERY Left 2001  . CARPAL TUNNEL RELEASE      FAMHx:  Family History  Problem Relation Age of Onset  . Hypertension Mother   . Hyperlipidemia Mother   . Hernia Mother   . Dementia Mother   . Obesity Mother   . Alzheimer's disease Father   . Diabetes Father   . Hypertension Father   . Dementia Father   . Hypertension Sister   . Rheumatic fever Sister   . Obesity Sister   . Obesity Brother   . Obesity Sister   . Obesity Sister   . Hyperlipidemia Sister   . Hypertension Sister   . Obesity Brother   . Hypertension Brother   . Diabetes Brother   . Breast cancer Maternal Aunt     SOCHx:   reports that she has never smoked. She  has never used smokeless tobacco. She reports that she does not drink alcohol and does not use drugs.  ALLERGIES:  Allergies  Allergen Reactions  . Sulfa Antibiotics Rash and Swelling    Throat swells  . Lipitor [Atorvastatin]     Myalgias   . Lovastatin     myalgias Other reaction(s): Unknown  . Pneumococcal Vaccine Hives    ROS: Pertinent items noted in HPI and remainder of comprehensive ROS otherwise negative.  HOME MEDS: Current Outpatient Medications on File Prior to Visit  Medication Sig Dispense Refill  . aspirin EC 81 MG tablet Take 81 mg by mouth daily.    Marland Kitchen escitalopram (LEXAPRO) 5 MG tablet Take 5 mg by mouth daily.    . eszopiclone (LUNESTA) 1 MG TABS  tablet 1 tablet    . Evolocumab (REPATHA SURECLICK) 945 MG/ML SOAJ Inject 1 Dose into the skin every 14 (fourteen) days. 2 mL 11  . HYDROcodone-acetaminophen (NORCO) 7.5-325 MG tablet Take 1 tablet by mouth every 6 (six) hours as needed for pain.  0  . LINZESS 290 MCG CAPS capsule Take 290 mcg by mouth daily. 30 minutes prior to first meal  1  . methocarbamol (ROBAXIN) 500 MG tablet Take 500 mg by mouth 2 (two) times daily.    . Multiple Vitamins-Minerals (MULTIVITAMIN ADULT PO) Take 1 tablet by mouth daily.    Marland Kitchen NARCAN 4 MG/0.1ML LIQD nasal spray kit Place 1 spray into the nose as directed.  0  . Vitamin D, Ergocalciferol, (DRISDOL) 1.25 MG (50000 UT) CAPS capsule TAKE 1 CAPSULE BY MOUTH ONCE A WEEK TO TREAT VITAMIN D DEFICIENCY     No current facility-administered medications on file prior to visit.    LABS/IMAGING: No results found for this or any previous visit (from the past 48 hour(s)). No results found.  LIPID PANEL:    Component Value Date/Time   CHOL 230 (H) 10/01/2020 0941   TRIG 62 10/01/2020 0941   HDL 86 10/01/2020 0941   CHOLHDL 2.7 10/01/2020 0941   CHOLHDL 4.4 10/15/2017 1132   LDLCALC 133 (H) 10/01/2020 0941   LDLCALC 249 (H) 10/15/2017 1132    WEIGHTS: Wt Readings from Last 3 Encounters:  10/10/20 136 lb 9.6 oz (62 kg)  09/10/20 138 lb (62.6 kg)  06/18/20 137 lb (62.1 kg)    VITALS: BP 116/62 (BP Location: Left Arm, Patient Position: Sitting, Cuff Size: Normal)   Pulse 74   Ht 5' 2"  (1.575 m)   Wt 136 lb 9.6 oz (62 kg)   BMI 24.98 kg/m   EXAM: General appearance: alert and no distress Neck: no carotid bruit, no JVD and thyroid not enlarged, symmetric, no tenderness/mass/nodules Lungs: clear to auscultation bilaterally Heart: regular rate and rhythm Abdomen: soft, non-tender; bowel sounds normal; no masses,  no organomegaly Extremities: extremities normal, atraumatic, no cyanosis or edema Pulses: 2+ and symmetric Skin: Skin color, texture, turgor  normal. No rashes or lesions Neurologic: Grossly normal Psych: Pleasant   EKG: Deferred  ASSESSMENT: 1. 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 Coronary artery disease in her father 3. Multistatin intolerance-myalgias/allergic rash  PLAN: 1.   Samantha Haley did have an ApoE mutation which was considered a strong risk factor for coronary disease however had no coronary calcifications.  She has had a great response to Repatha with marked reduction in total cholesterol greater than 50% reduction in her LDL.  She is tolerating this well without any significant  side effects.  I would recommend continuing at this point.  She is not interested in trying statins due to the side effects associated with that.  Given no coronary calcium I think were okay to continue with this therapy.  Plan follow-up with me annually or sooner as necessary.  Pixie Casino, MD, [facility], [place] Director of the Advanced Lipid Disorders &  Cardiovascular Risk Reduction Clinic Diplomate of the American Board of Clinical Lipidology Attending Cardiologist  Direct Dial: [phone]  Fax: [phone]  Website:  [email or web address] Ludella Pranger 10/10/2020, 8:17 AM

## 2020-10-15 ENCOUNTER — Other Ambulatory Visit (HOSPITAL_COMMUNITY): Payer: Self-pay | Admitting: Family

## 2020-10-15 DIAGNOSIS — Z1231 Encounter for screening mammogram for malignant neoplasm of breast: Secondary | ICD-10-CM

## 2020-11-19 ENCOUNTER — Telehealth: Payer: Self-pay | Admitting: Internal Medicine

## 2020-11-19 NOTE — Telephone Encounter (Signed)
PA Case: [id number], Status: Approved, Coverage Starts on: 05/12/2020 12:00:00 AM, Coverage Ends on: 05/11/2021 12:00:00 AM

## 2020-11-19 NOTE — Telephone Encounter (Signed)
PA for repatha sureclick submitted via CMM (Key: [id number])

## 2020-11-26 ENCOUNTER — Other Ambulatory Visit: Payer: Self-pay

## 2020-11-26 ENCOUNTER — Ambulatory Visit (HOSPITAL_COMMUNITY)
Admission: RE | Admit: 2020-11-26 | Discharge: 2020-11-26 | Disposition: A | Discharge status: Home / Self Care | Payer: Medicare Other | Source: Ambulatory Visit | Attending: Family | Admitting: Family

## 2020-11-26 DIAGNOSIS — Z1231 Encounter for screening mammogram for malignant neoplasm of breast: Secondary | ICD-10-CM | POA: Insufficient documentation

## 2021-02-11 ENCOUNTER — Encounter: Payer: Self-pay | Admitting: Family Medicine

## 2021-02-11 ENCOUNTER — Ambulatory Visit (INDEPENDENT_AMBULATORY_CARE_PROVIDER_SITE_OTHER): Payer: Medicare Other | Admitting: Family Medicine

## 2021-02-11 ENCOUNTER — Other Ambulatory Visit: Payer: Self-pay

## 2021-02-11 VITALS — BP 110/70 | HR 68 | Ht 62.0 in | Wt 134.6 lb

## 2021-02-11 DIAGNOSIS — I10 Essential (primary) hypertension: Secondary | ICD-10-CM

## 2021-02-11 DIAGNOSIS — E782 Mixed hyperlipidemia: Secondary | ICD-10-CM

## 2021-02-11 DIAGNOSIS — Z789 Other specified health status: Secondary | ICD-10-CM

## 2021-02-11 NOTE — Patient Instructions (Signed)

## 2021-02-11 NOTE — Progress Notes (Addendum)
Cardiology Office Note  Date: 02/11/2021   ID: Samauri, Kellenberger 03-May-1951, MRN [id number]  PCP:  Jessee Avers, FNP  Cardiologist:  None Electrophysiologist:  None   Chief Complaint: Follow-up shortness of breath, atrial fibrillation. History of hypertension, hyperlipidemia, abnormal EKG, chest pain  History of Present Illness: Samantha Haley is a 70 y.o. female with a history of hypertension, hyperlipidemia, abnormal EKG, chest pain, chronic lower back pain.  Last seen by Kerin Ransom, PA on June 14, 2018 in follow-up for hyperlipidemia. Previous presentation to [facility] March 2018 with chest pain with code STEMI activation.  She was transferred to [facility].  Code STEMI was canceled.  Her troponins were negative.  Chest CTA was negative for PE.  EKG revealed very old artifact.  Echo showed normal LV function.  Outpatient nuclear stress test was low risk.  She has been having problems with dyslipidemia and issues with intolerance to statin medications giving her cramps in legs and hands.  She was back on simvastatin.  Lipid panel in January 2020 revealed LDL 133.  Liver function and renal function were normal on lab work.  There was mention of possibly starting a PCSK9 inhibitor.  Recent cholesterol panel drawn on 11/11/2019 at [facility] practice : total cholesterol 351, triglyceride 53, HDL 82, VLDL 6, LDL of 243.  Patient has been intolerant of statins in the past as mentioned above.  She was seen by Dr Debara Pickett at lipid clinic 06/18/2020 . It was recommended she start PCSK9 inhibitor. Plans were to start Repatha 140 mg q 2 weeks. In addition it was recommended genetic testing be performed. Also recommended calcium scoring for further risk re-stratification. To recheck lipids in 3 months. CT for Coronary calcium score of 0. This is a low risk study. Mild aortic atherosclerosis.   She is here for 49-monthfollow-up today.  She has no complaints.  She denies any anginal or  exertional symptoms, palpitations or arrhythmias, lightheadedness, dizziness, shortness of breath or DOE.  Denies any PND, orthopnea, CVA or TIA-like symptoms.  Denies any bleeding issues, claudication, DVT or PE-like symptoms, or lower extremity edema.  Appears to be tolerating Repatha well.  She has follow-up lab work requisition with her and is scheduled to have lab work/lipid panel and lipids with decreased in LDL from 243 down to 133 on recent lab work.  She has a follow-up with Dr. HDebara Pickettin June 2023.   Past Medical History:  Diagnosis Date   Abnormal electrocardiogram (ECG) (EKG)    Chest pain    Chronic lower back pain    Constipation    Fibromyalgia    Hyperlipidemia    Hypertension     Past Surgical History:  Procedure Laterality Date   BACK SURGERY Left 2001   CARPAL TUNNEL RELEASE      Current Outpatient Medications  Medication Sig Dispense Refill   aspirin EC 81 MG tablet Take 81 mg by mouth daily.     escitalopram (LEXAPRO) 5 MG tablet Take 5 mg by mouth daily.     eszopiclone (LUNESTA) 1 MG TABS tablet 1 tablet     Evolocumab (REPATHA SURECLICK) 1379MG/ML SOAJ Inject 1 Dose into the skin every 14 (fourteen) days. 2 mL 11   HYDROcodone-acetaminophen (NORCO) 7.5-325 MG tablet Take 1 tablet by mouth every 6 (six) hours as needed for pain.  0   LINZESS 290 MCG CAPS capsule Take 290 mcg by mouth daily. 30 minutes prior to first meal  1   methocarbamol (ROBAXIN) 500 MG tablet Take 500 mg by mouth 2 (two) times daily.     Multiple Vitamins-Minerals (MULTIVITAMIN ADULT PO) Take 1 tablet by mouth daily.     NARCAN 4 MG/0.1ML LIQD nasal spray kit Place 1 spray into the nose as directed.  0   Vitamin D, Ergocalciferol, (DRISDOL) 1.25 MG (50000 UT) CAPS capsule TAKE 1 CAPSULE BY MOUTH ONCE A WEEK TO TREAT VITAMIN D DEFICIENCY     No current facility-administered medications for this visit.   Allergies:  Sulfa antibiotics, Lipitor [atorvastatin], Lovastatin, and Pneumococcal  vaccine   Social History: The patient  reports that she has never smoked. She has never used smokeless tobacco. She reports that she does not drink alcohol and does not use drugs.   Family History: The patient's family history includes Alzheimer's disease in her father; Breast cancer in her maternal aunt; Dementia in her father and mother; Diabetes in her brother and father; Hernia in her mother; Hyperlipidemia in her mother and sister; Hypertension in her brother, father, mother, sister, and sister; Obesity in her brother, brother, mother, sister, sister, and sister; Rheumatic fever in her sister.   ROS:  Please see the history of present illness. Otherwise, complete review of systems is positive for none.  All other systems are reviewed and negative.   Physical Exam: VS:  BP 110/70   Pulse 68   Ht 5' 2"  (1.575 m)   Wt 134 lb 9.6 oz (61.1 kg)   SpO2 98%   BMI 24.62 kg/m , BMI Body mass index is 24.62 kg/m.  Wt Readings from Last 3 Encounters:  02/11/21 134 lb 9.6 oz (61.1 kg)  10/10/20 136 lb 9.6 oz (62 kg)  09/10/20 138 lb (62.6 kg)    General: Patient appears comfortable at rest. Neck: Supple, no elevated JVP or carotid bruits, no thyromegaly. Lungs: Clear to auscultation, nonlabored breathing at rest. Cardiac: Regular rate and rhythm, no S3 or significant systolic murmur, no pericardial rub. Extremities: No pitting edema, distal pulses 2+. Skin: Warm and dry. Musculoskeletal: No kyphosis. Neuropsychiatric: Alert and oriented x3, affect grossly appropriate.  ECG:  An ECG dated 03/22/2020 was personally reviewed today and demonstrated:  Normal sinus rhythm rate of 70.  Recent Labwork: No results found for requested labs within last 8760 hours.     Component Value Date/Time   CHOL 230 (H) 10/01/2020 0941   TRIG 62 10/01/2020 0941   HDL 86 10/01/2020 0941   CHOLHDL 2.7 10/01/2020 0941   CHOLHDL 4.4 10/15/2017 1132   LDLCALC 133 (H) 10/01/2020 0941   LDLCALC 249 (H)  10/15/2017 1132    Other Studies Reviewed Today:   CT for Coronary artery Calcium Scoring 07/19/2020 IMPRESSION: Coronary calcium score of 0. This is a low risk study.  Mild aortic atherosclerosis.    Echocardiogram 07/26/2016 Study Conclusions   - Left ventricle: The cavity size was normal. Systolic function was    normal. The estimated ejection fraction was in the range of 60%    to 65%. Wall motion was normal; there were no regional wall    motion abnormalities. Left ventricular diastolic function    parameters were normal.  - Mitral valve: Calcified annulus. Minimal calcification. There was    trivial regurgitation. Valve area by pressure half-time: 1.41    cm^2.  - Pulmonic valve: There was trivial regurgitation.    Assessment and Plan:  1. Mixed hyperlipidemia   2. Statin intolerance   3. Essential hypertension  1. Mixed hyperlipidemia Recent lipid panel 11/11/2019 at PCP office: Total cholesterol 331, triglycerides 53, HDL 82, VLDL 6, LDL 243.  Recently saw Dr. Debara Pickett and was started on PCSK9 inhibitor Repatha.  140 mg every 2 weeks.  Follow-up lipids on 10/01/2020 showed TC 230, HDL 86, TG 62, LDL 133.Marland Kitchen  She will follow-up with Dr. Debara Pickett 10 October 2021.  She denies any significant side effects from Repatha use.  Continue aspirin 81 mg daily.  Continue Repatha 140 mg subcu q. 14 days.  2. Essential Hypertension BP today 110/70 not on antihypertensive therapy currently  3. Statin intolerance Previous statin intolerances.  Atorvastatin and simvastatin.  Currently on Repatha and tolerating well 140 mg every 2 weeks.  Medication Adjustments/Labs and Tests Ordered: Current medicines are reviewed at length with the patient today.  Concerns regarding medicines are outlined above.   Disposition: Follow-up with Dr. Harl Bowie or APP 1 year  Signed, Levell July, NP 02/11/2021 1:58 PM    [facility] at [place], [place], [place] [address] Phone:  [phone]; Fax: [phone]

## 2021-04-11 ENCOUNTER — Other Ambulatory Visit: Payer: Self-pay | Admitting: Internal Medicine

## 2021-04-11 DIAGNOSIS — E7849 Other hyperlipidemia: Secondary | ICD-10-CM

## 2021-05-03 ENCOUNTER — Telehealth: Payer: Self-pay

## 2021-05-03 NOTE — Telephone Encounter (Signed)
Authorization already on file. Starting on 05/12/2020 and ending on 05/11/2022. Key:  [id number]

## 2021-05-27 ENCOUNTER — Other Ambulatory Visit: Payer: Self-pay | Admitting: *Deleted

## 2021-05-27 DIAGNOSIS — E7849 Other hyperlipidemia: Secondary | ICD-10-CM

## 2021-05-27 DIAGNOSIS — M791 Myalgia, unspecified site: Secondary | ICD-10-CM

## 2021-10-22 ENCOUNTER — Other Ambulatory Visit (HOSPITAL_COMMUNITY): Payer: Self-pay | Admitting: Family

## 2021-10-22 DIAGNOSIS — Z1231 Encounter for screening mammogram for malignant neoplasm of breast: Secondary | ICD-10-CM

## 2021-10-28 LAB — LIPID PANEL
Chol/HDL Ratio: 2.3 ratio (ref 0.0–4.4)
Cholesterol, Total: 199 mg/dL (ref 100–199)
HDL: 88 mg/dL (ref >39)
LDL Chol Calc (NIH): 98 mg/dL (ref 0–99)
Triglycerides: 71 mg/dL (ref 0–149)
VLDL Cholesterol Cal: 13 mg/dL (ref 5–40)

## 2021-11-04 ENCOUNTER — Ambulatory Visit (INDEPENDENT_AMBULATORY_CARE_PROVIDER_SITE_OTHER): Payer: Medicare Other | Admitting: Internal Medicine

## 2021-11-04 ENCOUNTER — Encounter: Payer: Self-pay | Admitting: Internal Medicine

## 2021-11-04 VITALS — BP 115/62 | HR 58 | Ht 62.0 in | Wt 133.4 lb

## 2021-11-04 DIAGNOSIS — E7849 Other hyperlipidemia: Secondary | ICD-10-CM | POA: Diagnosis not present

## 2021-11-04 DIAGNOSIS — T466X5D Adverse effect of antihyperlipidemic and antiarteriosclerotic drugs, subsequent encounter: Secondary | ICD-10-CM

## 2021-11-04 DIAGNOSIS — E782 Mixed hyperlipidemia: Secondary | ICD-10-CM

## 2021-11-04 DIAGNOSIS — M791 Myalgia, unspecified site: Secondary | ICD-10-CM

## 2021-11-04 NOTE — Progress Notes (Signed)
LIPID CLINIC CONSULT NOTE  Chief Complaint:  Follow-up dyslipidemia  Primary Care Physician: Hattie Perch, FNP  Primary Cardiologist:  None  HPI:  Samantha Haley is a 71 y.o. female who is being seen today for the evaluation of dyslipidemia at the request of Samantha Harding, NP.  This is a pleasant 71 year old female kindly referred for evaluation and management of dyslipidemia.  Previous cardiology patient of Dr. Purvis Sheffield and has been seen most recently by Samantha Harding, NP in [place].  She actually has no known history of coronary disease.  In 2018 apparently she had EKG changes concerning for acute STEMI and was sent urgently to the Cath Lab however never underwent a catheterization as it was not felt to be true.  She did have stress testing which was negative for ischemia.  She was having symptoms concerning for unstable angina.  She had a chest CT which was negative for PE.  No mention of coronary calcium was noted.  She has had longstanding issue with dyslipidemia and statin intolerance including leg and hand cramping.  She has tried simvastatin, atorvastatin, lovastatin and rosuvastatin, all of which caused myalgias and rash/allergic reaction.  There is a family history of heart disease including her father who died at age 43 of coronary disease.  Her most recent lipid profile showed total cholesterol 331, triglycerides 53, HDL 82 and LDL 243.  This is of course concerning for possible familial hyperlipidemia.  Also of note she says she is lost about 50 pounds over the past year and noted no significant improvement in her lipids.  She made significant dietary changes as well.  She is fairly sedentary.  10/10/2020  Samantha Haley returns today for follow-up.  She is doing very well on Repatha.  She has had marked improvement on her lipids.  Total cholesterol now 230, HDL 86, triglycerides 62 and LDL of 133, down from 243.  Fortunately her coronary calcium score was 0 although she has a  strong family history of heart disease.  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  As of now she has achieved greater than 50% reduction in LDL cholesterol and she is satisfied with that due to lack of side effects.  11/04/2021  Samantha Haley returns today for follow-up.  She continues to do very well on Repatha.  Her cholesterol is additionally reduced now with total 199, triglycerides 71, HDL 88 and LDL 98.  This is down from total of 230 and LDL of 133 previously, which was down from 243 prior to that.   PMHx:  Past Medical History:  Diagnosis Date   Abnormal electrocardiogram (ECG) (EKG)    Chest pain    Chronic lower back pain    Constipation    Fibromyalgia    Hyperlipidemia    Hypertension     Past Surgical History:  Procedure Laterality Date   BACK SURGERY Left 2001   CARPAL TUNNEL RELEASE      FAMHx:  Family History  Problem Relation Age of Onset   Hypertension Mother    Hyperlipidemia Mother    Hernia Mother    Dementia Mother    Obesity Mother    Alzheimer's disease Father    Diabetes Father    Hypertension Father    Dementia Father    Hypertension Sister    Rheumatic fever Sister    Obesity Sister    Obesity Brother  Obesity Sister    Obesity Sister    Hyperlipidemia Sister    Hypertension Sister    Obesity Brother    Hypertension Brother    Diabetes Brother    Breast cancer Maternal Aunt     SOCHx:   reports that she has never smoked. She has never used smokeless tobacco. She reports that she does not drink alcohol and does not use drugs.  ALLERGIES:  Allergies  Allergen Reactions   Sulfa Antibiotics Rash and Swelling    Throat swells   Lipitor [Atorvastatin]     Myalgias    Lovastatin     myalgias Other reaction(s): Unknown   Pneumococcal Vaccine Hives   Zolpidem Nausea And Vomiting    Unknown reaction    ROS: Pertinent items noted in HPI  and remainder of comprehensive ROS otherwise negative.  HOME MEDS: Current Outpatient Medications on File Prior to Visit  Medication Sig Dispense Refill   Calcium Polycarbophil (FIBERTAB PO) Take by mouth.     clonazePAM (KLONOPIN) 1 MG tablet 1 tab(s) orally 1 times a day for 30 day(s)     escitalopram (LEXAPRO) 5 MG tablet Take 5 mg by mouth daily.     eszopiclone (LUNESTA) 1 MG TABS tablet 1 tablet     HYDROcodone-acetaminophen (NORCO) 7.5-325 MG tablet Take 1 tablet by mouth every 6 (six) hours as needed for pain.  0   LINZESS 290 MCG CAPS capsule Take 290 mcg by mouth daily. 30 minutes prior to first meal  1   methocarbamol (ROBAXIN) 500 MG tablet Take 500 mg by mouth 2 (two) times daily.     Multiple Vitamins-Minerals (MULTIVITAMIN ADULT PO) Take 1 tablet by mouth daily.     naloxone (NARCAN) nasal spray 4 mg/0.1 mL as needed.     NARCAN 4 MG/0.1ML LIQD nasal spray kit Place 1 spray into the nose as directed.  0   polyethylene glycol powder (MIRALAX) 17 GM/SCOOP powder Take 1 Container by mouth once.     REPATHA SURECLICK 140 MG/ML SOAJ INJECT CONTENTS OF 1 SYRINGE SUBCUTANEOUSLY ONCE DAILY EVERY 14 DAYS 2 mL 11   aspirin EC 81 MG tablet Take 81 mg by mouth daily. (Patient not taking: Reported on 11/04/2021)     No current facility-administered medications on file prior to visit.    LABS/IMAGING: No results found for this or any previous visit (from the past 48 hour(s)). No results found.  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   Ms. Drain has had further and sustained reduction in her lipids.  She remains on Repatha.  She has been exercising more and has made dietary changes adding fiber to her diet.  This is the best LDL cholesterol she is ever seen.  Overall I am pleased and she is now at target as she had no coronary calcium.  We will continue with current therapy.  Plan follow-up with me annually or sooner as necessary.  Chrystie Nose, MD, [facility], FACP  [place]  [facility]  Medical Director of the Advanced Lipid Disorders &  Cardiovascular Risk Reduction Clinic Diplomate of the American Board of Clinical Lipidology Attending Cardiologist  Direct Dial: [phone]  Fax: [phone]  Website:  [email or web address]  Chrystie Nose 11/04/2021, 10:37 AM

## 2021-11-28 ENCOUNTER — Ambulatory Visit (HOSPITAL_COMMUNITY): Payer: Medicare Other

## 2021-12-01 IMAGING — MG MM DIGITAL SCREENING BILAT W/ TOMO AND CAD
6 of 10 series · 6 of 30 positions shown · non-contrast
Comparison: Previous exam(s).

CLINICAL DATA: Screening.

EXAM:
DIGITAL SCREENING BILATERAL MAMMOGRAM WITH TOMOSYNTHESIS AND CAD
TECHNIQUE: Bilateral screening digital craniocaudal and mediolateral oblique
mammograms were obtained. Bilateral screening digital breast
tomosynthesis was performed. The images were evaluated with
computer-aided detection.

[L MLO synth-2D (1 of 2)]
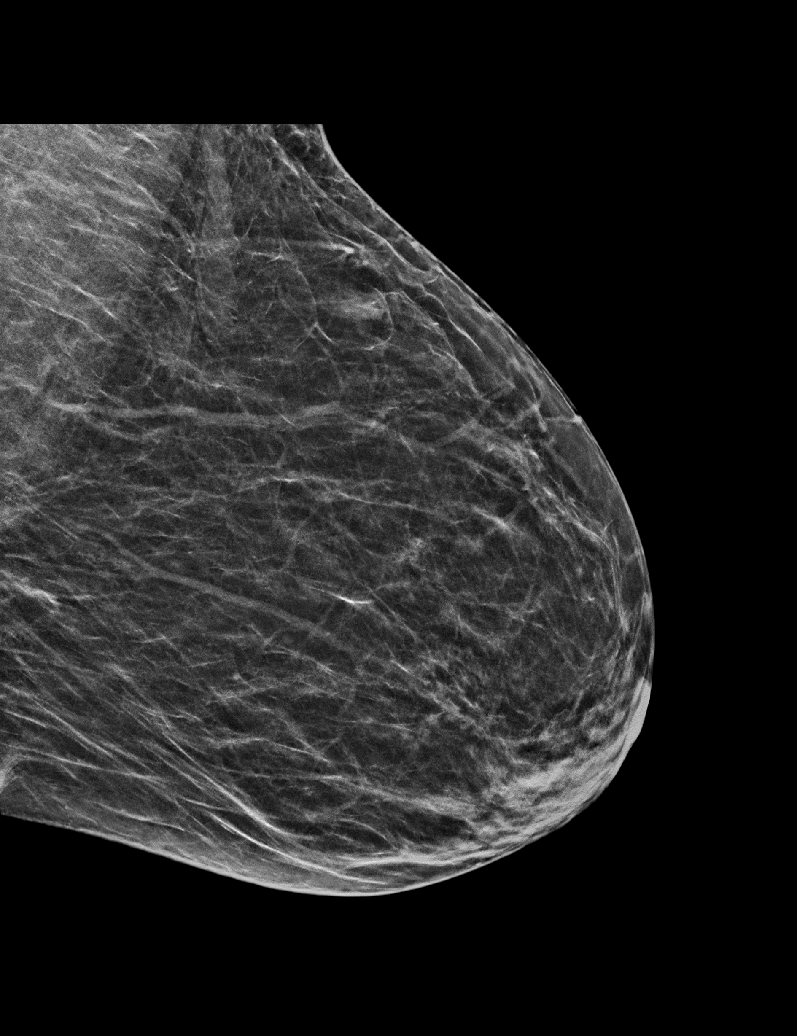

[R MLO synth-2D]
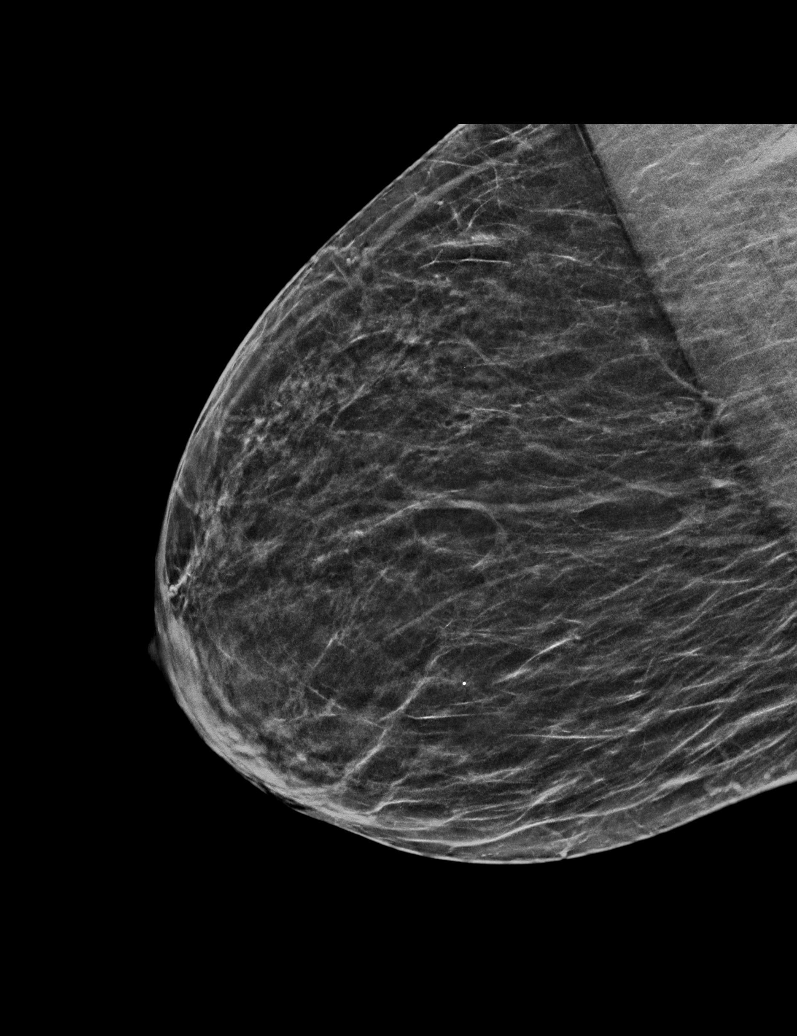

[L CC synth-2D]
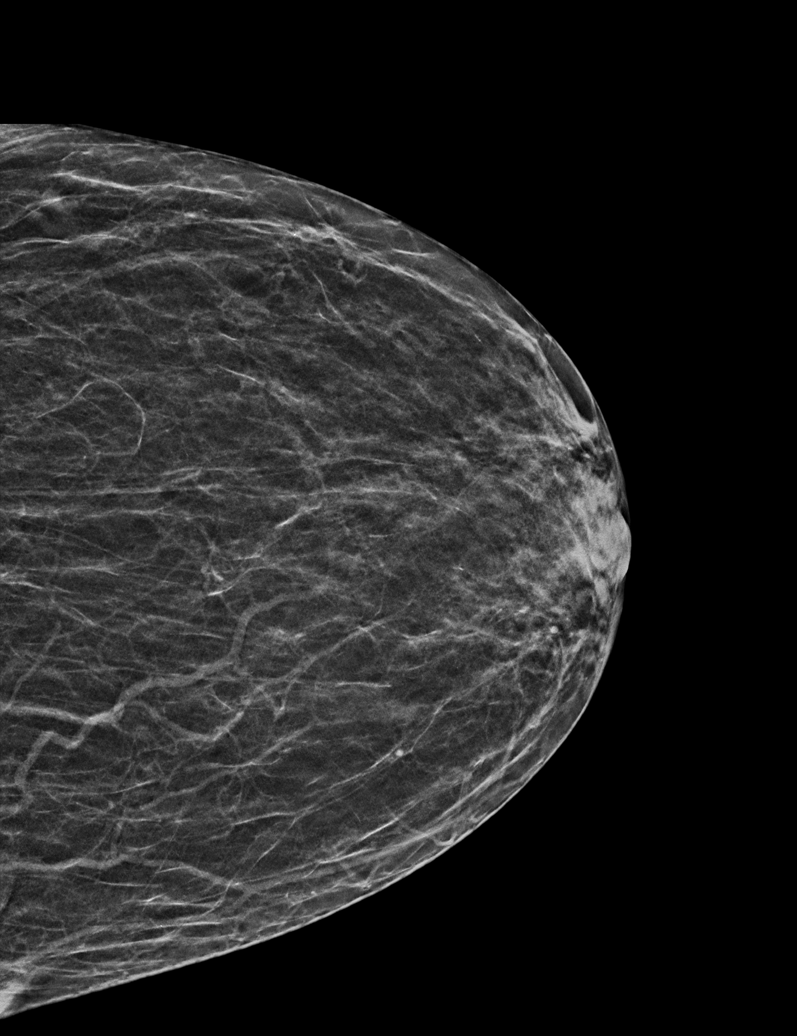

[R CC synth-2D]
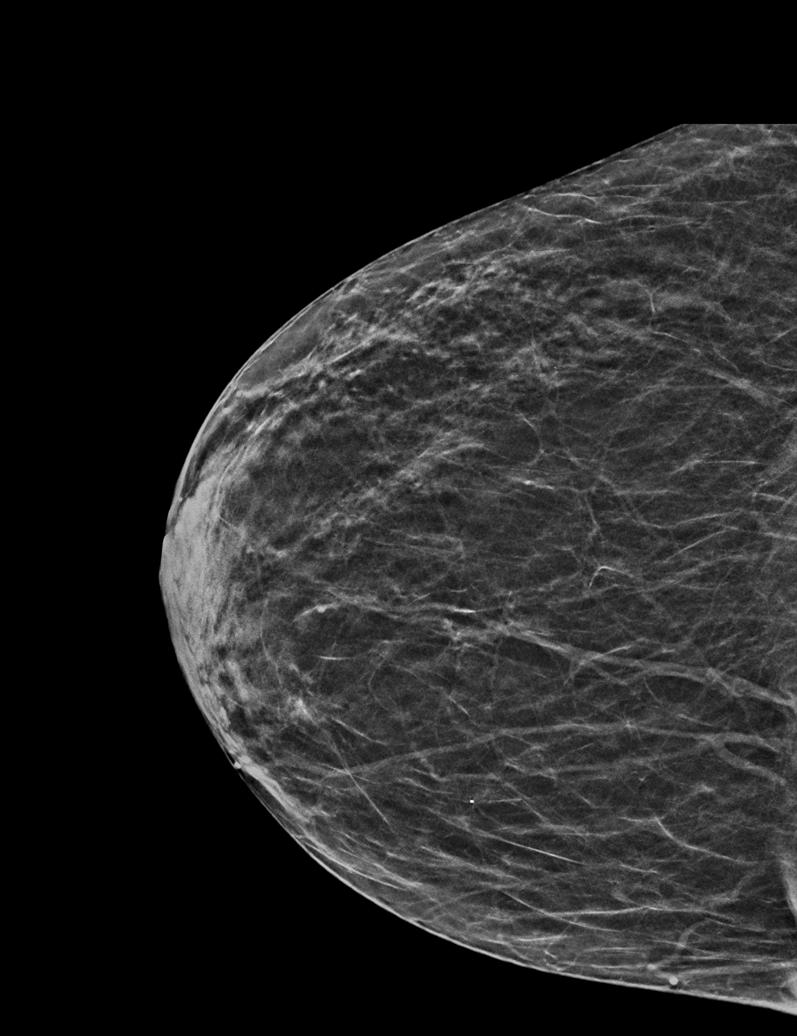

[L MLO synth-2D (2 of 2)]
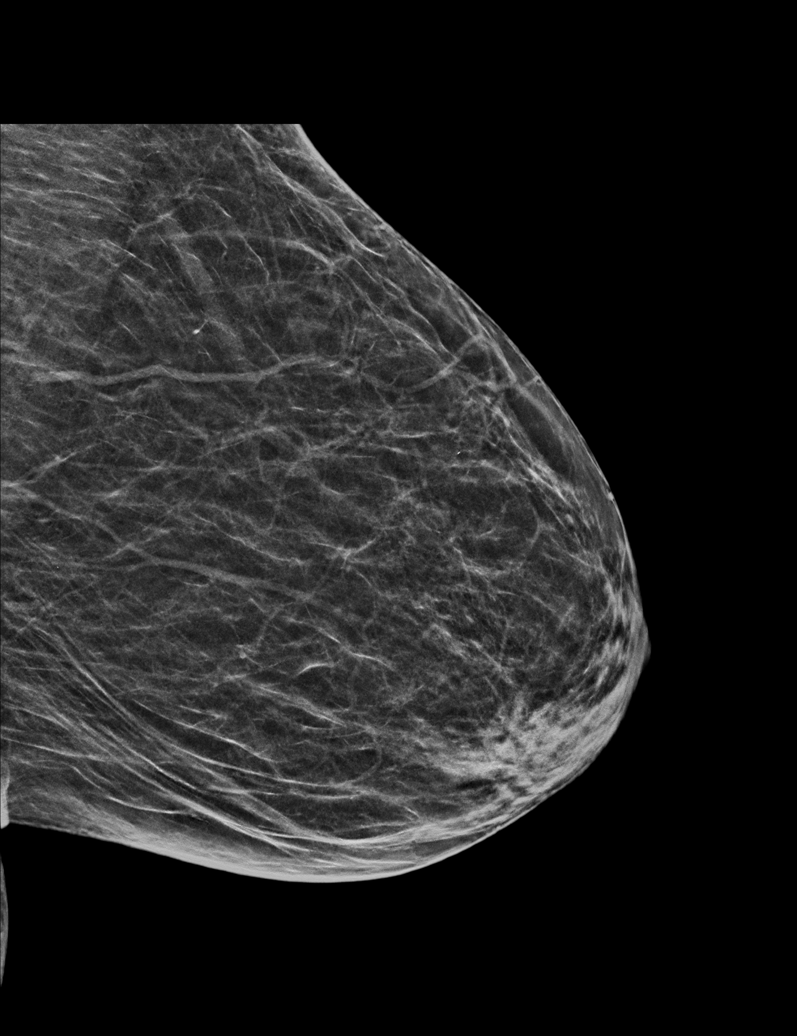

[R MLO tomo · tomo slice 23/46.0]
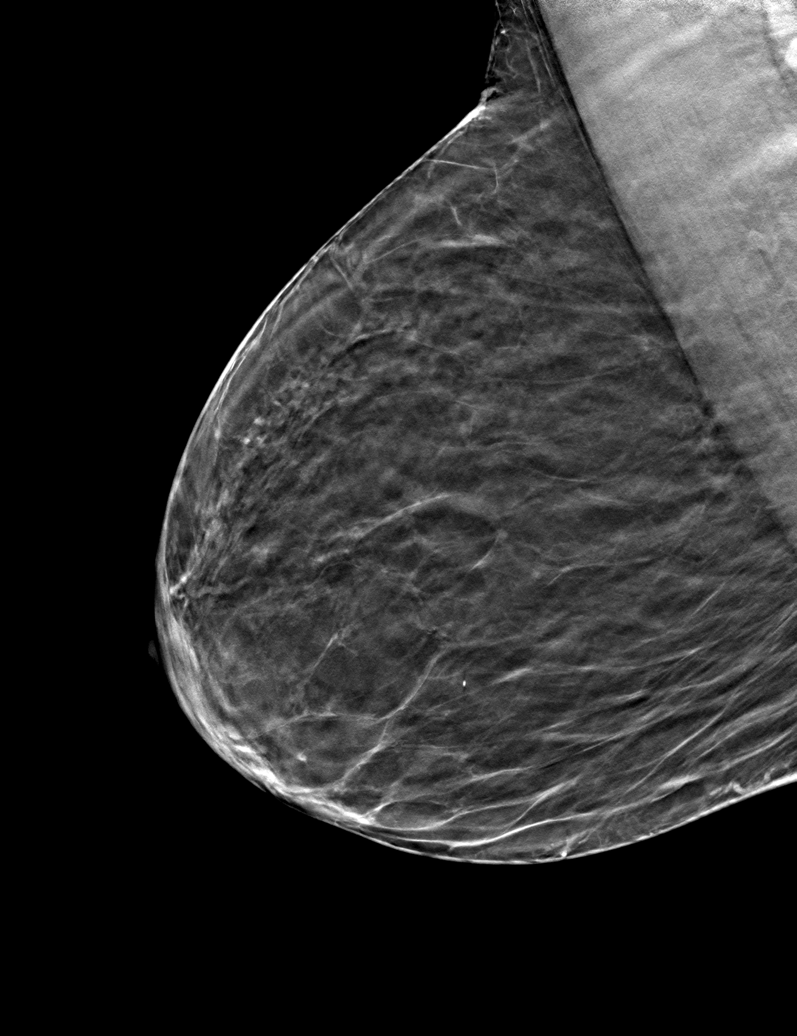

[6 of 30 positions shown; findings below may reference images not displayed]

ACR Breast Density Category b: There are scattered areas of
fibroglandular density.
FINDINGS: There are no findings suspicious for malignancy.
IMPRESSION: No mammographic evidence of malignancy. A result letter of this
screening mammogram will be mailed directly to the patient.

RECOMMENDATION:
Screening mammogram in one year. (Code:51-O-LD2)

BI-RADS CATEGORY  1: Negative.

## 2021-12-02 ENCOUNTER — Ambulatory Visit (HOSPITAL_COMMUNITY)
Admission: RE | Admit: 2021-12-02 | Discharge: 2021-12-02 | Disposition: A | Discharge status: Home / Self Care | Payer: Medicare Other | Source: Ambulatory Visit | Attending: Family | Admitting: Family

## 2021-12-02 DIAGNOSIS — Z1231 Encounter for screening mammogram for malignant neoplasm of breast: Secondary | ICD-10-CM | POA: Insufficient documentation

## 2022-01-28 ENCOUNTER — Other Ambulatory Visit: Payer: Self-pay | Admitting: Internal Medicine

## 2022-01-28 DIAGNOSIS — E7849 Other hyperlipidemia: Secondary | ICD-10-CM

## 2022-04-25 ENCOUNTER — Ambulatory Visit: Payer: Medicare Other | Attending: Internal Medicine | Admitting: Internal Medicine

## 2022-04-25 ENCOUNTER — Encounter: Payer: Self-pay | Admitting: Internal Medicine

## 2022-04-25 VITALS — BP 124/82 | HR 73 | Ht 62.0 in | Wt 131.6 lb

## 2022-04-25 DIAGNOSIS — I10 Essential (primary) hypertension: Secondary | ICD-10-CM | POA: Insufficient documentation

## 2022-04-25 NOTE — Patient Instructions (Signed)

## 2022-04-25 NOTE — Progress Notes (Signed)
Cardiology Office Note  Date: 04/25/2022   ID: Samantha Haley, Samantha Haley 11-30-1950, MRN [id number]  PCP:  Jessee Avers, FNP  Cardiologist:  Chalmers Guest, MD Electrophysiologist:  None   Reason for Office Visit: HLD management   History of Present Illness: Samantha Haley is a 71 y.o. 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 LDL goal is less than 100 and she is currently at goal. Denied any symptoms of angina, DOE, palpitations, syncope, dizziness/lightheadedness and LE swelling.  Past Medical History:  Diagnosis Date   Abnormal electrocardiogram (ECG) (EKG)    Chest pain    Chronic lower back pain    Constipation    Fibromyalgia    Hyperlipidemia    Hypertension     Past Surgical History:  Procedure Laterality Date   BACK SURGERY Left 2001   CARPAL TUNNEL RELEASE      Current Outpatient Medications  Medication Sig Dispense Refill   aspirin EC 81 MG tablet Take 81 mg by mouth daily.     Calcium Polycarbophil (FIBERTAB PO) Take by mouth.     clonazePAM (KLONOPIN) 1 MG tablet 1 tab(s) orally 1 times a day for 30 day(s)     escitalopram (LEXAPRO) 10 MG tablet Take 10 mg by mouth daily.     eszopiclone (LUNESTA) 1 MG TABS tablet 1 tablet     Evolocumab (REPATHA SURECLICK) 626 MG/ML SOAJ INJECT 1 SYRINGE SUBCUTANEOUSLY EVERY 2 WEEKS 6 mL 3   HYDROcodone-acetaminophen (NORCO) 7.5-325 MG tablet Take 1 tablet by mouth every 6 (six) hours as needed for pain.  0   LINZESS 290 MCG CAPS capsule Take 290 mcg by mouth daily. 30 minutes prior to first meal  1   methocarbamol (ROBAXIN) 500 MG tablet Take 500 mg by mouth 2 (two) times daily.     Multiple Vitamins-Minerals (MULTIVITAMIN ADULT PO) Take 1 tablet by mouth daily.     naloxone (NARCAN) nasal spray 4 mg/0.1 mL as needed.     NARCAN 4 MG/0.1ML LIQD nasal spray kit Place 1 spray into the nose as  directed.  0   polyethylene glycol powder (MIRALAX) 17 GM/SCOOP powder Take 1 Container by mouth once.     No current facility-administered medications for this visit.   Allergies:  Sulfa antibiotics, Lipitor [atorvastatin], Lovastatin, Pneumococcal vaccine, and Zolpidem   Social History: The patient  reports that she has never smoked. She has never used smokeless tobacco. She reports that she does not drink alcohol and does not use drugs.   Family History: The patient's family history includes Alzheimer's disease in her father; Breast cancer in her maternal aunt; Dementia in her father and mother; Diabetes in her brother and father; Hernia in her mother; Hyperlipidemia in her mother and sister; Hypertension in her brother, father, mother, sister, and sister; Obesity in her brother, brother, mother, sister, sister, and sister; Rheumatic fever in her sister.   ROS:  Please see the history of present illness. Otherwise, complete review of systems is positive for none.  All other systems are reviewed and negative.   Physical Exam: VS:  BP 124/82   Pulse 73   Ht _0  (1.575 m)   Wt 131 lb 9.6 oz (59.7 kg)   SpO2 98%   BMI 24.07 kg/m , BMI Body mass index is 24.07 kg/m.  Wt Readings from Last 3 Encounters:  04/25/22  131 lb 9.6 oz (59.7 kg)  11/04/21 133 lb 6.4 oz (60.5 kg)  02/11/21 134 lb 9.6 oz (61.1 kg)    General: Patient appears comfortable at rest. HEENT: Conjunctiva and lids normal, oropharynx clear with moist mucosa. Neck: Supple, no elevated JVP or carotid bruits, no thyromegaly. Lungs: Clear to auscultation, nonlabored breathing at rest. Cardiac: Regular rate and rhythm, no S3 or significant systolic murmur, no pericardial rub. Abdomen: Soft, nontender, no hepatomegaly, bowel sounds present, no guarding or rebound. Extremities: No pitting edema, distal pulses 2+. Skin: Warm and dry. Musculoskeletal: No kyphosis. Neuropsychiatric: Alert and oriented x3, affect grossly  appropriate.  ECG:  NSR  Recent Labwork: No results found for requested labs within last 365 days.     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   # HLD with multi statin intolerance and ApoE mutation, currently at goal -Continue Repatha -LDL goal less than 100  # Primary prevention of CAD -Calcium scoring of coronary artery disease 0. No indication of aspirin.  I have spent a total of 22 minutes with patient reviewing chart, EKGs, labs and examining patient as well as establishing an assessment and plan that was discussed with the patient.  > 50% of time was spent in direct patient care.      Medication Adjustments/Labs and Tests Ordered: Current medicines are reviewed at length with the patient today.  Concerns regarding medicines are outlined above.   Tests Ordered: Orders Placed This Encounter  Procedures   EKG 12-Lead    Medication Changes: No orders of the defined types were placed in this encounter.   Disposition:  Follow up  one year  Signed Desjuan Stearns Fidel Levy, MD, 04/25/2022 12:46 PM    [facility] at [place], [place], [place] [address]

## 2022-09-08 ENCOUNTER — Other Ambulatory Visit: Payer: Self-pay | Admitting: *Deleted

## 2022-09-08 DIAGNOSIS — E7849 Other hyperlipidemia: Secondary | ICD-10-CM

## 2022-11-21 LAB — LIPID PANEL
Chol/HDL Ratio: 2.1 ratio (ref 0.0–4.4)
Cholesterol, Total: 196 mg/dL (ref 100–199)
HDL: 92 mg/dL (ref >39)
LDL Chol Calc (NIH): 95 mg/dL (ref 0–99)
Triglycerides: 46 mg/dL (ref 0–149)
VLDL Cholesterol Cal: 9 mg/dL (ref 5–40)

## 2022-11-28 ENCOUNTER — Ambulatory Visit: Payer: Medicare Other | Attending: Internal Medicine | Admitting: Internal Medicine

## 2022-11-28 ENCOUNTER — Encounter: Payer: Self-pay | Admitting: Internal Medicine

## 2022-11-28 VITALS — BP 120/70 | HR 65 | Ht 62.0 in | Wt 132.4 lb

## 2022-11-28 DIAGNOSIS — M791 Myalgia, unspecified site: Secondary | ICD-10-CM | POA: Diagnosis present

## 2022-11-28 DIAGNOSIS — I10 Essential (primary) hypertension: Secondary | ICD-10-CM | POA: Diagnosis not present

## 2022-11-28 DIAGNOSIS — T466X5D Adverse effect of antihyperlipidemic and antiarteriosclerotic drugs, subsequent encounter: Secondary | ICD-10-CM | POA: Diagnosis not present

## 2022-11-28 DIAGNOSIS — E7849 Other hyperlipidemia: Secondary | ICD-10-CM | POA: Diagnosis not present

## 2022-11-28 DIAGNOSIS — T466X5A Adverse effect of antihyperlipidemic and antiarteriosclerotic drugs, initial encounter: Secondary | ICD-10-CM | POA: Diagnosis present

## 2022-11-28 NOTE — Progress Notes (Signed)
LIPID CLINIC CONSULT NOTE  Chief Complaint:  Follow-up dyslipidemia  Primary Care Physician: Hattie Perch, FNP  Primary Cardiologist:  Marjo Bicker, MD  HPI:  Samantha Haley is a 72 y.o. female who is being seen today for the evaluation of dyslipidemia at the request of [name], NP.  This is a pleasant 72 year old female kindly referred for evaluation and management of dyslipidemia.  Previous cardiology patient of Dr. Purvis Sheffield and has been seen most recently by [name], NP in [place].  She actually has no known history of coronary disease.  In 2018 apparently she had EKG changes concerning for acute STEMI and was sent urgently to the Cath Lab however never underwent a catheterization as it was not felt to be true.  She did have stress testing which was negative for ischemia.  She was having symptoms concerning for unstable angina.  She had a chest CT which was negative for PE.  No mention of coronary calcium was noted.  She has had longstanding issue with dyslipidemia and statin intolerance including leg and hand cramping.  She has tried simvastatin, atorvastatin, lovastatin and rosuvastatin, all of which caused myalgias and rash/allergic reaction.  There is a family history of heart disease including her father who died at age 47 of coronary disease.  Her most recent lipid profile showed total cholesterol 331, triglycerides 53, HDL 82 and LDL 243.  This is of course concerning for possible familial hyperlipidemia.  Also of note she says she is lost about 50 pounds over the past year and noted no significant improvement in her lipids.  She made significant dietary changes as well.  She is fairly sedentary.  10/10/2020  Samantha Haley returns today for follow-up.  She is doing very well on Repatha.  She has had marked improvement on her lipids.  Total cholesterol now 230, HDL 86, triglycerides 62 and LDL of 133, down from 243.  Fortunately her coronary calcium score was 0  although she has a strong family history of heart disease.  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  As of now she has achieved greater than 50% reduction in LDL cholesterol and she is satisfied with that due to lack of side effects.  11/04/2021  Samantha Haley returns today for follow-up.  She continues to do very well on Repatha.  Her cholesterol is additionally reduced now with total 199, triglycerides 71, HDL 88 and LDL 98.  This is down from total of 230 and LDL of 133 previously, which was down from 243 prior to that.  11/28/2022  Samantha Haley is seen today in follow-up. She continues to do well on Repatha. No side effects - lipids have been stable. TC now 196, TG 46, HDL 92 and LDL 95 (was 98 last year).  PMHx:  Past Medical History:  Diagnosis Date   Abnormal electrocardiogram (ECG) (EKG)    Chest pain    Chronic lower back pain    Constipation    Fibromyalgia    Hyperlipidemia    Hypertension     Past Surgical History:  Procedure Laterality Date   BACK SURGERY Left 2001   CARPAL TUNNEL RELEASE      FAMHx:  Family History  Problem Relation Age of Onset   Hypertension Mother    Hyperlipidemia Mother    Hernia Mother    Dementia Mother    Obesity Mother  Alzheimer's disease Father    Diabetes Father    Hypertension Father    Dementia Father    Hypertension Sister    Rheumatic fever Sister    Obesity Sister    Obesity Brother    Obesity Sister    Obesity Sister    Hyperlipidemia Sister    Hypertension Sister    Obesity Brother    Hypertension Brother    Diabetes Brother    Breast cancer Maternal Aunt     SOCHx:   reports that she has never smoked. She has never used smokeless tobacco. She reports that she does not drink alcohol and does not use drugs.  ALLERGIES:  Allergies  Allergen Reactions   Sulfa Antibiotics Rash and Swelling    Throat swells   Lipitor  [Atorvastatin]     Myalgias    Lovastatin     myalgias Other reaction(s): Unknown   Pneumococcal Vaccine Hives   Zolpidem Nausea And Vomiting    Unknown reaction    ROS: Pertinent items noted in HPI and remainder of comprehensive ROS otherwise negative.  HOME MEDS: Current Outpatient Medications on File Prior to Visit  Medication Sig Dispense Refill   aspirin EC 81 MG tablet Take 81 mg by mouth daily.     Calcium Polycarbophil (FIBERTAB PO) Take by mouth.     clonazePAM (KLONOPIN) 1 MG tablet 1 tab(s) orally 1 times a day for 30 day(s)     escitalopram (LEXAPRO) 10 MG tablet Take 10 mg by mouth daily.     eszopiclone (LUNESTA) 1 MG TABS tablet 1 tablet     Evolocumab (REPATHA SURECLICK) 140 MG/ML SOAJ INJECT 1 SYRINGE SUBCUTANEOUSLY EVERY 2 WEEKS 6 mL 3   LINZESS 290 MCG CAPS capsule Take 290 mcg by mouth daily. 30 minutes prior to first meal  1   methocarbamol (ROBAXIN) 500 MG tablet Take 500 mg by mouth 2 (two) times daily.     Multiple Vitamins-Minerals (MULTIVITAMIN ADULT PO) Take 1 tablet by mouth daily.     NARCAN 4 MG/0.1ML LIQD nasal spray kit Place 1 spray into the nose as directed.  0   Oxycodone HCl 10 MG TABS Take 10 mg by mouth 4 (four) times daily.     polyethylene glycol powder (MIRALAX) 17 GM/SCOOP powder Take 1 Container by mouth once.     No current facility-administered medications on file prior to visit.    LABS/IMAGING: No results found for this or any previous visit (from the past 48 hour(s)). No results found.  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   Ms.  Haley is doing well on Repatha. Cholesterol remains at goal with LDL <100. No changes in her medications. Follow-up with me annually or sooner as necessary.  Chrystie Nose, MD, [facility], FACP  [place]  [facility]  Medical Director of the Advanced Lipid Disorders &  Cardiovascular Risk Reduction Clinic Diplomate of the American Board of Clinical Lipidology Attending Cardiologist  Direct Dial: [phone]  Fax: [phone]  Website:  [email or web address] Nicely Laniesha Das 11/28/2022, 11:41 AM

## 2022-11-28 NOTE — Patient Instructions (Signed)
Medication Instructions:  NO CHANGES  *If you need a refill on your cardiac medications before your next appointment, please call your pharmacy*   Lab Work: FASTING lab work in 1 year to check cholesterol   If you have labs (blood work) drawn today and your tests are completely normal, you will receive your results only by: MyChart Message (if you have MyChart) OR A paper copy in the mail If you have any lab test that is abnormal or we need to change your treatment, we will call you to review the results.     Follow-Up: At [facility], you and your health needs are our priority.  As part of our continuing mission to provide you with exceptional heart care, we have created designated Provider Care Teams.  These Care Teams include your primary Cardiologist (physician) and Advanced Practice Providers (APPs -  Physician Assistants and Nurse Practitioners) who all work together to provide you with the care you need, when you need it.  We recommend signing up for the patient portal called "MyChart".  Sign up information is provided on this After Visit Summary.  MyChart is used to connect with patients for Virtual Visits (Telemedicine).  Patients are able to view lab/test results, encounter notes, upcoming appointments, etc.  Non-urgent messages can be sent to your provider as well.   To learn more about what you can do with MyChart, go to [email or web address].    Your next appointment:    12 months - lipid clinic - Dr. Marinus Maw

## 2022-12-07 ENCOUNTER — Other Ambulatory Visit: Payer: Self-pay | Admitting: Internal Medicine

## 2022-12-07 DIAGNOSIS — E7849 Other hyperlipidemia: Secondary | ICD-10-CM

## 2022-12-15 ENCOUNTER — Encounter (HOSPITAL_COMMUNITY): Payer: Self-pay | Admitting: Family

## 2023-04-27 ENCOUNTER — Ambulatory Visit: Payer: Medicare Other | Attending: Internal Medicine | Admitting: Internal Medicine

## 2023-04-27 VITALS — BP 118/80 | HR 76 | Ht 62.0 in | Wt 106.4 lb

## 2023-04-27 DIAGNOSIS — I10 Essential (primary) hypertension: Secondary | ICD-10-CM | POA: Diagnosis not present

## 2023-04-27 DIAGNOSIS — E782 Mixed hyperlipidemia: Secondary | ICD-10-CM | POA: Insufficient documentation

## 2023-04-27 NOTE — Progress Notes (Signed)
Cardiology Office Note  Date: 04/27/2023   ID: Samantha Haley, DOB [birth year], MRN [id number]  PCP:  System, Provider Not In  Cardiologist:  Marjo Bicker, MD Electrophysiologist:  None   Reason for Office Visit: HLD management   History of Present Illness: Samantha Haley is a 72 y.o. 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   LDL goal is less than 100 and she is currently at goal.  Recent lipid panel was checked in July 2024 when her LDL was noted to be 95.  She recently had ER visit in [place] for new onset of gout in her right foot.  Her PCP discontinued Repatha.  Patient also reported that she was not able to afford Repatha as she had to pay $2000 out of her pocket for 6 injections.  Otherwise, she does not have any symptoms of angina, DOE, palpitations, syncope, dizziness and leg swelling.   Past Medical History:  Diagnosis Date   Abnormal electrocardiogram (ECG) (EKG)    Chest pain    Chronic lower back pain    Constipation    Fibromyalgia    Hyperlipidemia    Hypertension     Past Surgical History:  Procedure Laterality Date   BACK SURGERY Left 2001   CARPAL TUNNEL RELEASE      Current Outpatient Medications  Medication Sig Dispense Refill   allopurinol (ZYLOPRIM) 100 MG tablet Take 100 mg by mouth daily.     escitalopram (LEXAPRO) 10 MG tablet Take 10 mg by mouth daily.     eszopiclone (LUNESTA) 1 MG TABS tablet Take 1 mg by mouth at bedtime.     LINZESS 290 MCG CAPS capsule Take 290 mcg by mouth daily. 30 minutes prior to first meal  1   methocarbamol (ROBAXIN) 500 MG tablet Take 500 mg by mouth 2 (two) times daily.     Multiple Vitamins-Minerals (MULTIVITAMIN ADULT PO) Take 1 tablet by mouth daily.     NARCAN 4 MG/0.1ML LIQD nasal spray kit Place 1 spray into the nose as directed.  0   Oxycodone HCl 10 MG TABS Take 10 mg  by mouth 4 (four) times daily.     polyethylene glycol powder (MIRALAX) 17 GM/SCOOP powder Take 1 Container by mouth as needed.     aspirin EC 81 MG tablet Take 81 mg by mouth daily. (Patient not taking: Reported on 04/27/2023)     No current facility-administered medications for this visit.   Allergies:  Sulfa antibiotics, Crestor [rosuvastatin], Lipitor [atorvastatin], Lovastatin, Pneumococcal vaccine, and Zolpidem   Social History: The patient  reports that she has never smoked. She has never used smokeless tobacco. She reports that she does not drink alcohol and does not use drugs.   Family History: The patient's family history includes Alzheimer's disease in her father; Breast cancer in her maternal aunt; Dementia in her father and mother; Diabetes in her brother and father; Hernia in her mother; Hyperlipidemia in her mother and sister; Hypertension in her brother, father, mother, sister, and sister; Obesity in her brother, brother, mother, sister, sister, and sister; Rheumatic fever in her sister.   ROS:  Please see the history of present illness. Otherwise, complete review of systems is positive for none.  All other systems are reviewed and negative.   Physical Exam: VS:  BP 118/80   Pulse 76   Ht 5\' 2"  (1.575  m)   Wt 106 lb 6.4 oz (48.3 kg)   SpO2 97%   BMI 19.46 kg/m , BMI Body mass index is 19.46 kg/m.  Wt Readings from Last 3 Encounters:  04/27/23 106 lb 6.4 oz (48.3 kg)  11/28/22 132 lb 6.4 oz (60.1 kg)  04/25/22 131 lb 9.6 oz (59.7 kg)    General: Patient appears comfortable at rest. HEENT: Conjunctiva and lids normal, oropharynx clear with moist mucosa. Neck: Supple, no elevated JVP or carotid bruits, no thyromegaly. Lungs: Clear to auscultation, nonlabored breathing at rest. Cardiac: Regular rate and rhythm, no S3 or significant systolic murmur, no pericardial rub. Abdomen: Soft, nontender, no hepatomegaly, bowel sounds present, no guarding or rebound. Extremities:  No pitting edema, distal pulses 2+. Skin: Warm and dry. Musculoskeletal: No kyphosis. Neuropsychiatric: Alert and oriented x3, affect grossly appropriate.  ECG:  NSR  Recent Labwork: No results found for requested labs within last 365 days.     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   # HLD with multi statin intolerance and ApoE mutation, currently at goal -Patient reported that her PCP discontinued Repatha due to recent ER visit for gout and she also added that she was not able to afford Repatha (had to pay $2000 for 6 injections and that the grant funding would not cover her Repatha expenses).  Will repeat lipid panel now and again in 6 months. If she has LDL more than 100, she will benefit from bempedoic acid-ezetimibe combination or inclisiran.  # Primary prevention of CAD -Calcium score of the coronaries is 0.  No indication of aspirin.    Medication Adjustments/Labs and Tests Ordered: Current medicines are reviewed at length with the patient today.  Concerns regarding medicines are outlined above.   Tests Ordered: Orders Placed This Encounter  Procedures   Lipid panel   EKG 12-Lead    Medication Changes: No orders of the defined types were placed in this encounter.   Disposition:  Follow up  one year  Signed Sameer Teeple Verne Spurr, MD, 04/27/2023 12:50 PM    [facility]  HeartCare at [facility] [address] [place], [place], [place] [address]

## 2023-04-27 NOTE — Patient Instructions (Signed)
Medication Instructions:  Your physician recommends that you continue on your current medications as directed. Please refer to the Current Medication list given to you today.   Labwork: Lipid to be completed today and again in 6 months at [facility]  Testing/Procedures: None  Follow-Up: Your physician recommends that you schedule a follow-up appointment in: 1 year. You will receive a reminder call in about 8 months reminding you to schedule your appointment. If you don't receive this call, please contact our office.   Any Other Special Instructions Will Be Listed Below (If Applicable).  Thank you for choosing [facility]!      If you need a refill on your cardiac medications before your next appointment, please call your pharmacy.

## 2023-12-23 ENCOUNTER — Telehealth: Payer: Self-pay

## 2023-12-23 DIAGNOSIS — E7849 Other hyperlipidemia: Secondary | ICD-10-CM

## 2023-12-23 DIAGNOSIS — E782 Mixed hyperlipidemia: Secondary | ICD-10-CM

## 2023-12-23 NOTE — Telephone Encounter (Signed)
 Spoke with patient advised her that will mail lab order to her she can have completed once she has received it in the mail. Go to main entrance at [facility] and go to lab. Patient verbalized understanding.

## 2023-12-23 NOTE — Telephone Encounter (Signed)
 Called left patient message to call the office. Needs to have her Lipid panel checked as requested in her last visit with Dr. Mallipeddi.

## 2024-01-08 ENCOUNTER — Ambulatory Visit: Payer: Self-pay | Admitting: Internal Medicine

## 2024-01-12 NOTE — Telephone Encounter (Signed)
-----   Message from Vishnu P Mallipeddi sent at 01/08/2024  3:19 PM EDT ----- LDL 141, elevated.  Repatha  was cost prohibitive. Start Leqvio.  Would avoid bempedoic acid-ezetimibe due to recent gout. ----- Message ----- From: Delbra Krebs T Sent: 01/06/2024  11:51 AM EDT To: Vishnu P Mallipeddi, MD

## 2024-01-12 NOTE — Telephone Encounter (Signed)
 Patient is returning call.

## 2024-01-12 NOTE — Telephone Encounter (Signed)
 Spoke with patient regarding her lab results. Advised her that Dr. Mallipeddi is recommending gong back on Repatha  or Leqvio. Advised her there according to the eligibility that she may not be approved, but in order to start process she will need to come to the [place] office and sign form for us  to fax over and that treatments are done at the [facility]. I advised her that I could send in Repatha  and we can do another prior auth and send to our Patient assistance again. She declined due to her prior PCP taking her off due to her gout. I advised her that I will send a copy of lab report to her current PCP and she can reach out as well as call us  back if she decided to go forward with the Leqvio. Patient verbalized understanding
